# Patient Record
Sex: Female | Born: 1970 | Race: White | Hispanic: No | Marital: Married | Smoking: Current every day smoker
Health system: Southern US, Community
[De-identification: ages and names within clinical notes are randomized; demographics above are authoritative.]

## PROBLEM LIST (undated history)

## (undated) DIAGNOSIS — D649 Anemia, unspecified: Secondary | ICD-10-CM

## (undated) DIAGNOSIS — K219 Gastro-esophageal reflux disease without esophagitis: Secondary | ICD-10-CM

## (undated) HISTORY — PX: ABDOMINAL HYSTERECTOMY: SHX81

## (undated) HISTORY — PX: BACK SURGERY: SHX140

---

## 2008-01-16 ENCOUNTER — Encounter: Admission: RE | Admit: 2008-01-16 | Discharge: 2008-01-16 | Payer: Self-pay

## 2010-09-12 ENCOUNTER — Emergency Department (HOSPITAL_COMMUNITY)
Admission: EM | Admit: 2010-09-12 | Discharge: 2010-09-12 | Disposition: A | Discharge status: Home / Self Care | Payer: BC Managed Care – PPO | Attending: Emergency Medicine | Admitting: Emergency Medicine

## 2010-09-12 DIAGNOSIS — F1410 Cocaine abuse, uncomplicated: Secondary | ICD-10-CM | POA: Insufficient documentation

## 2010-09-12 LAB — COMPREHENSIVE METABOLIC PANEL
ALT: 9 U/L (ref 0–35)
Alkaline Phosphatase: 59 U/L (ref 39–117)
CO2: 25 mEq/L (ref 19–32)
GFR calc non Af Amer: >60 mL/min (ref >60)
Glucose, Bld: 81 mg/dL (ref 70–99)
Potassium: 3.7 mEq/L (ref 3.5–5.1)
Sodium: 143 mEq/L (ref 135–145)
Total Protein: 6.7 g/dL (ref 6.0–8.3)

## 2010-09-12 LAB — DIFFERENTIAL
Basophils Absolute: 0 10*3/uL (ref 0.0–0.1)
Lymphocytes Relative: 18 % (ref 12–46)
Monocytes Relative: 12 % (ref 3–12)

## 2010-09-12 LAB — CBC
HCT: 33.2 % — ABNORMAL LOW (ref 36.0–46.0)
Hemoglobin: 9.7 g/dL — ABNORMAL LOW (ref 12.0–15.0)
RBC: 4.7 MIL/uL (ref 3.87–5.11)
WBC: 14 10*3/uL — ABNORMAL HIGH (ref 4.0–10.5)

## 2010-09-12 LAB — URINE MICROSCOPIC-ADD ON

## 2010-09-12 LAB — RAPID URINE DRUG SCREEN, HOSP PERFORMED: Tetrahydrocannabinol: NOT DETECTED

## 2010-09-12 LAB — URINALYSIS, ROUTINE W REFLEX MICROSCOPIC
Glucose, UA: NEGATIVE mg/dL
Leukocytes, UA: NEGATIVE
Protein, ur: NEGATIVE mg/dL
Specific Gravity, Urine: 1.02 (ref 1.005–1.030)
Urobilinogen, UA: 0.2 mg/dL (ref 0.0–1.0)

## 2010-09-12 LAB — ETHANOL: Alcohol, Ethyl (B): <5 mg/dL (ref 0–10)

## 2011-03-06 ENCOUNTER — Encounter (HOSPITAL_COMMUNITY)
Admission: RE | Admit: 2011-03-06 | Discharge: 2011-03-06 | Disposition: A | Discharge status: Home / Self Care | Payer: Medicaid Other | Source: Ambulatory Visit | Attending: Neurosurgery | Admitting: Neurosurgery

## 2011-03-06 LAB — COMPREHENSIVE METABOLIC PANEL
ALT: 12 U/L (ref 0–35)
BUN: 8 mg/dL (ref 6–23)
Calcium: 9.6 mg/dL (ref 8.4–10.5)
Creatinine, Ser: 0.52 mg/dL (ref 0.50–1.10)
GFR calc Af Amer: >90 mL/min (ref >90)
Glucose, Bld: 111 mg/dL — ABNORMAL HIGH (ref 70–99)
Sodium: 140 mEq/L (ref 135–145)
Total Protein: 7 g/dL (ref 6.0–8.3)

## 2011-03-06 LAB — CBC
HCT: 31.1 % — ABNORMAL LOW (ref 36.0–46.0)
MCHC: 28.9 g/dL — ABNORMAL LOW (ref 30.0–36.0)
RDW: 18.4 % — ABNORMAL HIGH (ref 11.5–15.5)

## 2011-03-06 LAB — SURGICAL PCR SCREEN
MRSA, PCR: NEGATIVE
Staphylococcus aureus: NEGATIVE

## 2011-03-09 ENCOUNTER — Ambulatory Visit (HOSPITAL_COMMUNITY): Payer: Medicaid Other

## 2011-03-09 ENCOUNTER — Ambulatory Visit (HOSPITAL_COMMUNITY)
Admission: RE | Admit: 2011-03-09 | Discharge: 2011-03-10 | Disposition: A | Discharge status: Home / Self Care | Payer: Medicaid Other | Source: Ambulatory Visit | Attending: Neurosurgery | Admitting: Neurosurgery

## 2011-03-09 DIAGNOSIS — F17200 Nicotine dependence, unspecified, uncomplicated: Secondary | ICD-10-CM | POA: Insufficient documentation

## 2011-03-09 DIAGNOSIS — M7130 Other bursal cyst, unspecified site: Secondary | ICD-10-CM | POA: Insufficient documentation

## 2011-03-09 DIAGNOSIS — Z01812 Encounter for preprocedural laboratory examination: Secondary | ICD-10-CM | POA: Insufficient documentation

## 2012-04-20 ENCOUNTER — Encounter (HOSPITAL_BASED_OUTPATIENT_CLINIC_OR_DEPARTMENT_OTHER): Payer: Self-pay | Admitting: Emergency Medicine

## 2012-04-20 ENCOUNTER — Emergency Department (HOSPITAL_BASED_OUTPATIENT_CLINIC_OR_DEPARTMENT_OTHER)
Admission: EM | Admit: 2012-04-20 | Discharge: 2012-04-20 | Disposition: A | Discharge status: Home / Self Care | Payer: Self-pay | Attending: Emergency Medicine | Admitting: Emergency Medicine

## 2012-04-20 DIAGNOSIS — A599 Trichomoniasis, unspecified: Secondary | ICD-10-CM

## 2012-04-20 DIAGNOSIS — N760 Acute vaginitis: Secondary | ICD-10-CM | POA: Insufficient documentation

## 2012-04-20 DIAGNOSIS — Z202 Contact with and (suspected) exposure to infections with a predominantly sexual mode of transmission: Secondary | ICD-10-CM | POA: Insufficient documentation

## 2012-04-20 DIAGNOSIS — B9689 Other specified bacterial agents as the cause of diseases classified elsewhere: Secondary | ICD-10-CM

## 2012-04-20 DIAGNOSIS — A5901 Trichomonal vulvovaginitis: Secondary | ICD-10-CM | POA: Insufficient documentation

## 2012-04-20 LAB — URINALYSIS, ROUTINE W REFLEX MICROSCOPIC
Ketones, ur: NEGATIVE mg/dL
Leukocytes, UA: NEGATIVE
Nitrite: NEGATIVE
Protein, ur: NEGATIVE mg/dL
Urobilinogen, UA: 0.2 mg/dL (ref 0.0–1.0)

## 2012-04-20 LAB — PREGNANCY, URINE: Preg Test, Ur: NEGATIVE

## 2012-04-20 MED ORDER — LIDOCAINE HCL (PF) 1 % IJ SOLN: 5.0000 mL | Freq: Once | INTRAMUSCULAR | Status: DC

## 2012-04-20 MED ORDER — METRONIDAZOLE 500 MG PO TABS: 500.0000 mg | ORAL_TABLET | Freq: Two times a day (BID) | ORAL | Status: DC

## 2012-04-20 MED ORDER — DOXYCYCLINE HYCLATE 100 MG PO CAPS: 100.0000 mg | ORAL_CAPSULE | Freq: Two times a day (BID) | ORAL | Status: DC

## 2012-04-20 MED ORDER — CEFTRIAXONE SODIUM 250 MG IJ SOLR
250.0000 mg | Freq: Once | INTRAMUSCULAR | Status: AC
  Administered 2012-04-20: 250 mg via INTRAMUSCULAR
  Filled 2012-04-20: qty 250

## 2012-04-20 MED ORDER — LIDOCAINE HCL (PF) 1 % IJ SOLN
INTRAMUSCULAR | Status: AC
  Filled 2012-04-20: qty 5

## 2012-04-20 NOTE — ED Notes (Signed)
Pt exposed to chlamydia from female partner approximately 4 months.  Pt is having some itching, also yellow vaginal discharge.  Slight abdominal pain prior to menstrual cycle.

## 2012-04-20 NOTE — ED Provider Notes (Signed)
History     CSN: [id number]  Arrival date & time 04/20/12  1131   First MD Initiated Contact with Patient 04/20/12 1152      Chief Complaint  Patient presents with  . Exposure to STD    (Consider location/radiation/quality/duration/timing/severity/associated sxs/prior treatment) HPI Comments: Patient complains of vaginal discharge that has been worsening over last 2 months. She recently got a call from her sexual partner that he tested positive for Chlamydia. She has some intermittent abdominal cramping but denies any current abdominal pain. She denies any vomiting or diarrhea. Denies any fevers or chills. Denies a history of sexual transmitted diseases. Denies any urinary symptoms. She tried to treat her symptoms with over-the-counter yeast medication without improvement  Patient is a 41 y.o. female presenting with STD exposure.  Exposure to STD Pertinent negatives include no chest pain, no abdominal pain, no headaches and no shortness of breath.    No past medical history on file.  Past Surgical History  Procedure Date  . Back surgery     No family history on file.  History  Substance Use Topics  . Smoking status: Not on file  . Smokeless tobacco: Not on file  . Alcohol Use:     OB History    Grav Para Term Preterm Abortions TAB SAB Ect Mult Living                  Review of Systems  Constitutional: Negative for fever, chills, diaphoresis and fatigue.  HENT: Negative for congestion, rhinorrhea and sneezing.   Eyes: Negative.   Respiratory: Negative for cough, chest tightness and shortness of breath.   Cardiovascular: Negative for chest pain and leg swelling.  Gastrointestinal: Negative for nausea, vomiting, abdominal pain, diarrhea and blood in stool.  Genitourinary: Positive for vaginal discharge. Negative for frequency, hematuria, flank pain and difficulty urinating.  Musculoskeletal: Negative for back pain and arthralgias.  Skin: Negative for rash.    Neurological: Negative for dizziness, speech difficulty, weakness, numbness and headaches.    Allergies  Review of patient's allergies indicates no known allergies.  Home Medications   Current Outpatient Rx  Name  Route  Sig  Dispense  Refill  . DOXYCYCLINE HYCLATE 100 MG PO CAPS   Oral   Take 1 capsule (100 mg total) by mouth 2 (two) times daily.   20 capsule   0   . METRONIDAZOLE 500 MG PO TABS   Oral   Take 1 tablet (500 mg total) by mouth 2 (two) times daily.   14 tablet   0     BP 109/56  Pulse 80  Temp 98.3 F (36.8 C)  Resp 14  Ht 5' 7.5" (1.715 m)  Wt 150 lb (68.04 kg)  BMI 23.15 kg/m2  SpO2 100%  LMP 03/30/2012  Physical Exam  Constitutional: She is oriented to person, place, and time. She appears well-developed and well-nourished.  HENT:  Head: Normocephalic and atraumatic.  Eyes: Pupils are equal, round, and reactive to light.  Neck: Normal range of motion. Neck supple.  Cardiovascular: Normal rate, regular rhythm and normal heart sounds.   Pulmonary/Chest: Effort normal and breath sounds normal. No respiratory distress. She has no wheezes. She has no rales. She exhibits no tenderness.  Abdominal: Soft. Bowel sounds are normal. There is no tenderness. There is no rebound and no guarding.  Musculoskeletal: Normal range of motion. She exhibits no edema.  Lymphadenopathy:    She has no cervical adenopathy.  Neurological: She is alert and  oriented to person, place, and time.  Skin: Skin is warm and dry. No rash noted.  Psychiatric: She has a normal mood and affect.    ED Course  Procedures (including critical care time)  Results for orders placed during the hospital encounter of 04/20/12  PREGNANCY, URINE      Component Value Range   Preg Test, Ur NEGATIVE  NEGATIVE  URINALYSIS, ROUTINE W REFLEX MICROSCOPIC      Component Value Range   Color, Urine YELLOW  YELLOW   APPearance CLEAR  CLEAR   Specific Gravity, Urine 1.023  1.005 - 1.030   pH 5.5   5.0 - 8.0   Glucose, UA NEGATIVE  NEGATIVE mg/dL   Hgb urine dipstick NEGATIVE  NEGATIVE   Bilirubin Urine NEGATIVE  NEGATIVE   Ketones, ur NEGATIVE  NEGATIVE mg/dL   Protein, ur NEGATIVE  NEGATIVE mg/dL   Urobilinogen, UA 0.2  0.0 - 1.0 mg/dL   Nitrite NEGATIVE  NEGATIVE   Leukocytes, UA NEGATIVE  NEGATIVE  WET PREP, GENITAL      Component Value Range   Yeast Wet Prep HPF POC NONE SEEN  NONE SEEN   Trich, Wet Prep MANY (*) NONE SEEN   Clue Cells Wet Prep HPF POC TOO NUMEROUS TO COUNT (*) NONE SEEN   WBC, Wet Prep HPF POC TOO NUMEROUS TO COUNT (*) NONE SEEN   No results found.    1. Trichomonas   2. BV (bacterial vaginosis)       MDM  Patient with positive Trichomonas and bacterial vaginosis. Since she had a known exposure to chlamydia I will go ahead and treat with Rocephin and doxycycline and will also add Flagyl for treatment of the Trichomonas and bacterial vaginosis. Advised her to followup with her OB/GYN who is in [facility] within the next week if her symptoms are not improving or return here as needed for any worsening symptoms. Also advised her that she might want to followup with her physician at health department for further testing for sexual transmitted diseases such as HIV and syphilis        Rolan Bucco, MD 04/20/12 1307

## 2012-04-20 NOTE — Discharge Instructions (Signed)
Trichomoniasis Trichomoniasis is an infection, caused by the Trichomonas organism, that affects both women and men. In women, the outer female genitalia and the vagina are affected. In men, the penis is mainly affected, but the prostate and other reproductive organs can also be involved. Trichomoniasis is a sexually transmitted disease (STD) and is most often passed to another person through sexual contact. The majority of people who get trichomoniasis do so from a sexual encounter and are also at risk for other STDs. CAUSES   Sexual intercourse with an infected partner.  It can be present in swimming pools or hot tubs. SYMPTOMS   Abnormal gray-green frothy vaginal discharge in women.  Vaginal itching and irritation in women.  Itching and irritation of the area outside the vagina in women.  Penile discharge with or without pain in males.  Inflammation of the urethra (urethritis), causing painful urination.  Bleeding after sexual intercourse. RELATED COMPLICATIONS  Pelvic inflammatory disease.  Infection of the uterus (endometritis).  Infertility.  Tubal (ectopic) pregnancy.  It can be associated with other STDs, including gonorrhea and chlamydia, hepatitis B, and HIV. COMPLICATIONS DURING PREGNANCY  Early (premature) delivery.  Premature rupture of the membranes (PROM).  Low birth weight. DIAGNOSIS   Visualization of Trichomonas under the microscope from the vagina discharge.  Ph of the vagina greater than 4.5, tested with a test tape.  Trich Rapid Test.  Culture of the organism, but this is not usually needed.  It may be found on a Pap test.  Having a "strawberry cervix,"which means the cervix looks very red like a strawberry. TREATMENT   You may be given medication to fight the infection. Inform your caregiver if you could be or are pregnant. Some medications used to treat the infection should not be taken during pregnancy.  Over-the-counter medications or  creams to decrease itching or irritation may be recommended.  Your sexual partner will need to be treated if infected. HOME CARE INSTRUCTIONS   Take all medication prescribed by your caregiver.  Take over-the-counter medication for itching or irritation as directed by your caregiver.  Do not have sexual intercourse while you have the infection.  Do not douche or wear tampons.  Discuss your infection with your partner, as your partner may have acquired the infection from you. Or, your partner may have been the person who transmitted the infection to you.  Have your sex partner examined and treated if necessary.  Practice safe, informed, and protected sex.  See your caregiver for other STD testing. SEEK MEDICAL CARE IF:   You still have symptoms after you finish the medication.  You have an oral temperature above 102 F (38.9 C).  You develop belly (abdominal) pain.  You have pain when you urinate.  You have bleeding after sexual intercourse.  You develop a rash.  The medication makes you sick or makes you throw up (vomit). Document Released: 11/01/2000 Document Revised: 07/31/2011 Document Reviewed: 11/27/2008 [facility] Patient Information 2013 [place], [place]. Vaginitis Vaginitis is an infection. It causes soreness, swelling, and redness (inflammation) of the vagina. Many of these infections are sexually transmitted diseases (STDs). Having unprotected sex can cause further problems and complications such as:  Chronic pelvic pain.  Infertility.  Unwanted pregnancy.  Abortion.  Tubal pregnancy.  Infection passed on to the newborn.  Cancer. CAUSES   Monilia. This is a yeast or fungus infection, not an STD.  Bacterial vaginosis. The normal balance of bacteria in the vagina is disrupted and is replaced by an overgrowth  of certain bacteria.  Gonorrhea, chlamydia. These are bacterial infections that are STDs.  Vaginal sponges, diaphragms, and intrauterine  devices.  Trichomoniasis. This is a STD infection caused by a parasite.  Viruses like herpes and human papillomavirus. Both are STDs.  Pregnancy.  Immunosuppression. This occurs with certain conditions such as HIV infection or cancer.  Using bubble bath.  Taking certain antibiotic medicines.  Sporadic recurrence can occur if you become sick.  Diabetes.  Steroids.  Allergic reaction. If you have an allergy to:  Douches.  Soaps.  Spermicides.  Condoms.  Scented tampons or vaginal sprays. SYMPTOMS   Abnormal vaginal discharge.  Itching of the vagina.  Pain in the vagina.  Swelling of the vagina. In some cases, there are no symptoms. TREATMENT  Treatment will vary depending on the type of infection.  Bacteria or trichomonas are usually treated with oral antibiotics and sometimes vaginal cream or suppositories.  Monilia vaginitis is usually treated with vaginal creams, suppositories, or oral antifungal pills.  Viral vaginitis has no cure. However, the symptoms of herpes (a viral vaginitis) can be treated to relieve the discomfort. Human papillomavirus has no symptoms. However, there are treatments for the diseases caused by human papillomavirus.  With allergic vaginitis, you need to stop using the product that is causing the problem. Vaginal creams can be used to treat the symptoms.  When treating an STD, the sex partner should also be treated. HOME CARE INSTRUCTIONS   Take all the medicines as directed by your caregiver.  Do not use scented tampons, soaps, or vaginal sprays.  Do not douche.  Tell your sex partner if you have a vaginal infection or an STD.  Do not have sexual intercourse until you have treated the vaginitis.  Practice safe sex by using condoms. SEEK MEDICAL CARE IF:   You have abdominal pain.  Your symptoms get worse during treatment. Document Released: 03/05/2007 Document Revised: 07/31/2011 Document Reviewed: 10/29/2008 [facility]  Patient Information 2013 [place], [place].

## 2012-04-20 NOTE — ED Notes (Signed)
Pelvic cart is set up and ready for the doctor to use. The patient is undressed and in a gown. The bed is locked and in the the lowest position. The bed rails are up the call light is within reach.

## 2012-04-22 LAB — GC/CHLAMYDIA PROBE AMP: GC Probe RNA: NEGATIVE

## 2012-09-27 ENCOUNTER — Other Ambulatory Visit: Payer: Self-pay | Admitting: Neurosurgery

## 2012-09-27 DIAGNOSIS — M545 Low back pain: Secondary | ICD-10-CM

## 2012-10-04 ENCOUNTER — Ambulatory Visit
Admission: RE | Admit: 2012-10-04 | Discharge: 2012-10-04 | Disposition: A | Discharge status: Home / Self Care | Payer: BC Managed Care – PPO | Source: Ambulatory Visit | Attending: Neurosurgery | Admitting: Neurosurgery

## 2012-10-04 DIAGNOSIS — M545 Low back pain, unspecified: Secondary | ICD-10-CM

## 2012-10-04 MED ORDER — GADOBENATE DIMEGLUMINE 529 MG/ML IV SOLN
14.0000 mL | Freq: Once | INTRAVENOUS | Status: AC | PRN
  Administered 2012-10-04: 14 mL via INTRAVENOUS

## 2012-12-25 ENCOUNTER — Ambulatory Visit: Payer: BC Managed Care – PPO | Admitting: Physical Therapy

## 2012-12-30 ENCOUNTER — Ambulatory Visit: Payer: BC Managed Care – PPO | Attending: Neurosurgery | Admitting: Physical Therapy

## 2012-12-30 DIAGNOSIS — M6281 Muscle weakness (generalized): Secondary | ICD-10-CM | POA: Insufficient documentation

## 2012-12-30 DIAGNOSIS — M545 Low back pain, unspecified: Secondary | ICD-10-CM | POA: Insufficient documentation

## 2012-12-30 DIAGNOSIS — IMO0001 Reserved for inherently not codable concepts without codable children: Secondary | ICD-10-CM | POA: Insufficient documentation

## 2012-12-30 DIAGNOSIS — M2550 Pain in unspecified joint: Secondary | ICD-10-CM | POA: Insufficient documentation

## 2013-01-01 ENCOUNTER — Ambulatory Visit: Payer: BC Managed Care – PPO | Admitting: Physical Therapy

## 2013-01-07 ENCOUNTER — Ambulatory Visit: Payer: BC Managed Care – PPO | Admitting: Rehabilitation

## 2013-01-09 ENCOUNTER — Ambulatory Visit: Payer: BC Managed Care – PPO | Admitting: Physical Therapy

## 2013-01-13 ENCOUNTER — Ambulatory Visit: Payer: BC Managed Care – PPO | Admitting: Rehabilitation

## 2013-01-15 ENCOUNTER — Ambulatory Visit: Payer: BC Managed Care – PPO | Admitting: Physical Therapy

## 2013-01-16 ENCOUNTER — Ambulatory Visit: Payer: BC Managed Care – PPO | Admitting: Physical Therapy

## 2013-01-21 ENCOUNTER — Ambulatory Visit: Payer: Medicaid Other | Attending: Neurosurgery | Admitting: Physical Therapy

## 2013-01-21 ENCOUNTER — Ambulatory Visit: Payer: Medicaid Other | Admitting: Physical Therapy

## 2013-01-21 DIAGNOSIS — M6281 Muscle weakness (generalized): Secondary | ICD-10-CM | POA: Insufficient documentation

## 2013-01-21 DIAGNOSIS — M545 Low back pain, unspecified: Secondary | ICD-10-CM | POA: Insufficient documentation

## 2013-01-21 DIAGNOSIS — M2550 Pain in unspecified joint: Secondary | ICD-10-CM | POA: Insufficient documentation

## 2013-01-21 DIAGNOSIS — IMO0001 Reserved for inherently not codable concepts without codable children: Secondary | ICD-10-CM | POA: Insufficient documentation

## 2013-02-24 ENCOUNTER — Other Ambulatory Visit: Payer: Self-pay | Admitting: Neurosurgery

## 2013-03-17 ENCOUNTER — Encounter (HOSPITAL_COMMUNITY): Payer: Self-pay

## 2013-03-19 ENCOUNTER — Encounter (HOSPITAL_COMMUNITY): Payer: Self-pay

## 2013-03-19 ENCOUNTER — Encounter (HOSPITAL_COMMUNITY)
Admission: RE | Admit: 2013-03-19 | Discharge: 2013-03-19 | Disposition: A | Discharge status: Home / Self Care | Payer: Medicaid Other | Source: Ambulatory Visit | Attending: Neurosurgery | Admitting: Neurosurgery

## 2013-03-19 DIAGNOSIS — Z01812 Encounter for preprocedural laboratory examination: Secondary | ICD-10-CM | POA: Insufficient documentation

## 2013-03-19 HISTORY — DX: Anemia, unspecified: D64.9

## 2013-03-19 HISTORY — DX: Gastro-esophageal reflux disease without esophagitis: K21.9

## 2013-03-19 LAB — CBC
HCT: 37.3 % (ref 36.0–46.0)
Hemoglobin: 11.6 g/dL — ABNORMAL LOW (ref 12.0–15.0)
MCH: 21.7 pg — ABNORMAL LOW (ref 26.0–34.0)
MCHC: 31.1 g/dL (ref 30.0–36.0)
MCV: 69.7 fL — ABNORMAL LOW (ref 78.0–100.0)
Platelets: 326 K/uL (ref 150–400)
RBC: 5.35 MIL/uL — ABNORMAL HIGH (ref 3.87–5.11)
RDW: 20 % — ABNORMAL HIGH (ref 11.5–15.5)
WBC: 9.2 K/uL (ref 4.0–10.5)

## 2013-03-19 LAB — BASIC METABOLIC PANEL WITH GFR
BUN: 7 mg/dL (ref 6–23)
CO2: 20 meq/L (ref 19–32)
Calcium: 9.3 mg/dL (ref 8.4–10.5)
Chloride: 108 meq/L (ref 96–112)
Creatinine, Ser: 0.51 mg/dL (ref 0.50–1.10)
GFR calc Af Amer: >90 mL/min
GFR calc non Af Amer: >90 mL/min
Glucose, Bld: 114 mg/dL — ABNORMAL HIGH (ref 70–99)
Potassium: 3.8 meq/L (ref 3.5–5.1)
Sodium: 142 meq/L (ref 135–145)

## 2013-03-19 LAB — SURGICAL PCR SCREEN
MRSA, PCR: NEGATIVE
Staphylococcus aureus: NEGATIVE

## 2013-03-19 LAB — TYPE AND SCREEN
ABO/RH(D): O POS
Antibody Screen: NEGATIVE

## 2013-03-19 LAB — HCG, SERUM, QUALITATIVE: Preg, Serum: NEGATIVE

## 2013-03-19 LAB — ABO/RH: ABO/RH(D): O POS

## 2013-03-19 NOTE — Pre-Procedure Instructions (Addendum)
SADIA BELFIORE  03/19/2013   Your procedure is scheduled on:  Thursday, March 27, 2013  Report to [facility] Short Stay (use Main Entrance "A'') at 5:30 AM.  Call this number if you have problems the morning of surgery: [phone]   Remember:   Do not eat food or drink liquids after midnight.   Take these medicines the morning of surgery with A SIP OF WATER: ranitidine (ZANTAC) 150 MG tablet  If needed:oxyCODONE (ROXICODONE) 15 MG immediate release tablet for pain  Stop taking Aspirin and herbal medications. Do not take any NSAIDs ie: Ibuprofen, Advil,  Naproxen or any medication containing Aspirin.  Do not wear jewelry, make-up or nail polish.  Do not wear lotions, powders, or perfumes. You may wear deodorant.  Do not shave 48 hours prior to surgery.   Do not bring valuables to the hospital.  [facility] is not responsible for any belongings or valuables.               Contacts, dentures or bridgework may not be worn into surgery.  Leave suitcase in the car. After surgery it may be brought to your room.  For patients admitted to the hospital, discharge time is determined by your treatment team.               Patients discharged the day of surgery will not be allowed to drive home.  Name and phone number of your driver:   Special Instructions: Shower using CHG 2 nights before surgery and the night before surgery.  If you shower the day of surgery use CHG.  Use special wash - you have one bottle of CHG for all showers.  You should use approximately 1/3 of the bottle for each shower.   Please read over the following fact sheets that you were given: Pain Booklet, Coughing and Deep Breathing, Blood Transfusion Information, MRSA Information and Surgical Site Infection Prevention

## 2013-03-26 MED ORDER — DEXAMETHASONE SODIUM PHOSPHATE 10 MG/ML IJ SOLN
10.0000 mg | INTRAMUSCULAR | Status: AC
  Administered 2013-03-27: 10 mg via INTRAVENOUS
  Filled 2013-03-26: qty 1

## 2013-03-26 MED ORDER — CEFAZOLIN SODIUM-DEXTROSE 2-3 GM-% IV SOLR
2.0000 g | INTRAVENOUS | Status: AC
  Administered 2013-03-27: 2 g via INTRAVENOUS
  Filled 2013-03-26: qty 50

## 2013-03-27 ENCOUNTER — Encounter (HOSPITAL_COMMUNITY): Payer: Self-pay | Admitting: *Deleted

## 2013-03-27 ENCOUNTER — Ambulatory Visit (HOSPITAL_COMMUNITY): Payer: Medicaid Other

## 2013-03-27 ENCOUNTER — Encounter (HOSPITAL_COMMUNITY)
Admission: RE | Disposition: A | Discharge status: Home / Self Care | Payer: Self-pay | Source: Ambulatory Visit | Attending: Neurosurgery

## 2013-03-27 ENCOUNTER — Ambulatory Visit (HOSPITAL_COMMUNITY): Payer: Medicaid Other | Admitting: Anesthesiology

## 2013-03-27 ENCOUNTER — Inpatient Hospital Stay (HOSPITAL_COMMUNITY)
Admission: RE | Admit: 2013-03-27 | Discharge: 2013-03-30 | DRG: 460 | Disposition: A | Discharge status: Home / Self Care | Payer: Medicaid Other | Source: Ambulatory Visit | Attending: Neurosurgery | Admitting: Neurosurgery

## 2013-03-27 ENCOUNTER — Encounter (HOSPITAL_COMMUNITY): Payer: Medicaid Other | Admitting: Anesthesiology

## 2013-03-27 DIAGNOSIS — M48061 Spinal stenosis, lumbar region without neurogenic claudication: Secondary | ICD-10-CM | POA: Diagnosis present

## 2013-03-27 DIAGNOSIS — F1210 Cannabis abuse, uncomplicated: Secondary | ICD-10-CM | POA: Diagnosis present

## 2013-03-27 DIAGNOSIS — M4310 Spondylolisthesis, site unspecified: Principal | ICD-10-CM | POA: Diagnosis present

## 2013-03-27 DIAGNOSIS — Z79899 Other long term (current) drug therapy: Secondary | ICD-10-CM

## 2013-03-27 DIAGNOSIS — F17200 Nicotine dependence, unspecified, uncomplicated: Secondary | ICD-10-CM | POA: Diagnosis present

## 2013-03-27 DIAGNOSIS — M7130 Other bursal cyst, unspecified site: Secondary | ICD-10-CM | POA: Diagnosis present

## 2013-03-27 DIAGNOSIS — K219 Gastro-esophageal reflux disease without esophagitis: Secondary | ICD-10-CM | POA: Diagnosis present

## 2013-03-27 SURGERY — POSTERIOR LUMBAR FUSION 1 LEVEL
Anesthesia: General | Site: Spine Lumbar | Wound class: Clean

## 2013-03-27 MED ORDER — HYDROMORPHONE HCL PF 1 MG/ML IJ SOLN
INTRAMUSCULAR | Status: AC
  Filled 2013-03-27: qty 1

## 2013-03-27 MED ORDER — BUPIVACAINE HCL (PF) 0.5 % IJ SOLN
INTRAMUSCULAR | Status: DC | PRN
  Administered 2013-03-27: 20 mL

## 2013-03-27 MED ORDER — OXYCODONE HCL 5 MG PO TABS
5.0000 mg | ORAL_TABLET | Freq: Once | ORAL | Status: AC | PRN
  Administered 2013-03-27: 5 mg via ORAL

## 2013-03-27 MED ORDER — OXYCODONE-ACETAMINOPHEN 5-325 MG PO TABS
ORAL_TABLET | ORAL | Status: AC
  Filled 2013-03-27: qty 1

## 2013-03-27 MED ORDER — OXYCODONE HCL 5 MG/5ML PO SOLN: 5.0000 mg | Freq: Once | ORAL | Status: AC | PRN

## 2013-03-27 MED ORDER — ARTIFICIAL TEARS OP OINT
TOPICAL_OINTMENT | OPHTHALMIC | Status: DC | PRN
  Administered 2013-03-27: 1 via OPHTHALMIC

## 2013-03-27 MED ORDER — DEXAMETHASONE SODIUM PHOSPHATE 4 MG/ML IJ SOLN: 4.0000 mg | Freq: Four times a day (QID) | INTRAMUSCULAR | Status: AC

## 2013-03-27 MED ORDER — CEFAZOLIN SODIUM-DEXTROSE 2-3 GM-% IV SOLR
2.0000 g | Freq: Three times a day (TID) | INTRAVENOUS | Status: AC
  Administered 2013-03-27 – 2013-03-28 (×3): 2 g via INTRAVENOUS
  Filled 2013-03-27 (×3): qty 50

## 2013-03-27 MED ORDER — SUFENTANIL CITRATE 50 MCG/ML IV SOLN
INTRAVENOUS | Status: DC | PRN
  Administered 2013-03-27 (×8): 10 ug via INTRAVENOUS

## 2013-03-27 MED ORDER — ZOLPIDEM TARTRATE 5 MG PO TABS
5.0000 mg | ORAL_TABLET | Freq: Every evening | ORAL | Status: DC | PRN
  Administered 2013-03-27 – 2013-03-28 (×2): 5 mg via ORAL
  Filled 2013-03-27 (×2): qty 1

## 2013-03-27 MED ORDER — PROPOFOL 10 MG/ML IV BOLUS
INTRAVENOUS | Status: DC | PRN
  Administered 2013-03-27: 180 mg via INTRAVENOUS

## 2013-03-27 MED ORDER — NEOSTIGMINE METHYLSULFATE 1 MG/ML IJ SOLN
INTRAMUSCULAR | Status: DC | PRN
  Administered 2013-03-27: 3 mg via INTRAVENOUS

## 2013-03-27 MED ORDER — THROMBIN 20000 UNITS EX SOLR
CUTANEOUS | Status: DC | PRN
  Administered 2013-03-27: 09:00:00 via TOPICAL

## 2013-03-27 MED ORDER — OXYCODONE HCL 5 MG PO TABS
ORAL_TABLET | ORAL | Status: AC
  Filled 2013-03-27: qty 1

## 2013-03-27 MED ORDER — PANTOPRAZOLE SODIUM 40 MG IV SOLR
40.0000 mg | Freq: Every day | INTRAVENOUS | Status: DC
  Administered 2013-03-27: 40 mg via INTRAVENOUS
  Filled 2013-03-27 (×2): qty 40

## 2013-03-27 MED ORDER — MENTHOL 3 MG MT LOZG: 1.0000 | LOZENGE | OROMUCOSAL | Status: DC | PRN

## 2013-03-27 MED ORDER — MIDAZOLAM HCL 5 MG/5ML IJ SOLN
INTRAMUSCULAR | Status: DC | PRN
  Administered 2013-03-27: 2 mg via INTRAVENOUS

## 2013-03-27 MED ORDER — HYDROMORPHONE HCL PF 1 MG/ML IJ SOLN
0.2500 mg | INTRAMUSCULAR | Status: DC | PRN
  Administered 2013-03-27 (×6): 0.5 mg via INTRAVENOUS

## 2013-03-27 MED ORDER — VECURONIUM BROMIDE 10 MG IV SOLR
INTRAVENOUS | Status: DC | PRN
  Administered 2013-03-27 (×2): 2 mg via INTRAVENOUS
  Administered 2013-03-27: 3 mg via INTRAVENOUS
  Administered 2013-03-27: 1 mg via INTRAVENOUS

## 2013-03-27 MED ORDER — HYDROMORPHONE HCL PF 1 MG/ML IJ SOLN
1.0000 mg | INTRAMUSCULAR | Status: DC | PRN
  Administered 2013-03-27 (×3): 1 mg via INTRAMUSCULAR
  Administered 2013-03-27: 1.5 mg via INTRAMUSCULAR
  Administered 2013-03-28: 1 mg via INTRAMUSCULAR
  Administered 2013-03-28: 1.5 mg via INTRAMUSCULAR
  Administered 2013-03-28: 0.5 mg via INTRAMUSCULAR
  Administered 2013-03-28 (×2): 1 mg via INTRAMUSCULAR
  Administered 2013-03-28 – 2013-03-29 (×2): 1.5 mg via INTRAMUSCULAR
  Administered 2013-03-29: 1 mg via INTRAMUSCULAR
  Administered 2013-03-29 (×2): 1.5 mg via INTRAMUSCULAR
  Filled 2013-03-27 (×2): qty 1
  Filled 2013-03-27: qty 2
  Filled 2013-03-27 (×2): qty 1
  Filled 2013-03-27 (×2): qty 2
  Filled 2013-03-27 (×2): qty 1
  Filled 2013-03-27: qty 2
  Filled 2013-03-27: qty 1
  Filled 2013-03-27: qty 2
  Filled 2013-03-27: qty 1
  Filled 2013-03-27: qty 2

## 2013-03-27 MED ORDER — 0.9 % SODIUM CHLORIDE (POUR BTL) OPTIME
TOPICAL | Status: DC | PRN
  Administered 2013-03-27: 1000 mL

## 2013-03-27 MED ORDER — PROMETHAZINE HCL 25 MG/ML IJ SOLN: 6.2500 mg | INTRAMUSCULAR | Status: DC | PRN

## 2013-03-27 MED ORDER — ACETAMINOPHEN 650 MG RE SUPP: 650.0000 mg | RECTAL | Status: DC | PRN

## 2013-03-27 MED ORDER — SODIUM CHLORIDE 0.9 % IR SOLN
Status: DC | PRN
  Administered 2013-03-27: 09:00:00

## 2013-03-27 MED ORDER — GLYCOPYRROLATE 0.2 MG/ML IJ SOLN
INTRAMUSCULAR | Status: DC | PRN
  Administered 2013-03-27: 0.4 mg via INTRAVENOUS

## 2013-03-27 MED ORDER — LIDOCAINE HCL (CARDIAC) 20 MG/ML IV SOLN
INTRAVENOUS | Status: DC | PRN
  Administered 2013-03-27: 80 mg via INTRAVENOUS

## 2013-03-27 MED ORDER — SODIUM CHLORIDE 0.9 % IV SOLN: 250.0000 mL | INTRAVENOUS | Status: DC

## 2013-03-27 MED ORDER — OXYCODONE HCL 5 MG PO TABS
ORAL_TABLET | ORAL | Status: AC
  Administered 2013-03-27: 5 mg
  Filled 2013-03-27: qty 1

## 2013-03-27 MED ORDER — SODIUM CHLORIDE 0.9 % IJ SOLN: 3.0000 mL | INTRAMUSCULAR | Status: DC | PRN

## 2013-03-27 MED ORDER — OXYCODONE-ACETAMINOPHEN 5-325 MG PO TABS
1.0000 | ORAL_TABLET | ORAL | Status: DC | PRN
  Administered 2013-03-27 (×2): 2 via ORAL
  Administered 2013-03-27: 1 via ORAL
  Administered 2013-03-28: 2 via ORAL
  Administered 2013-03-28: 1 via ORAL
  Administered 2013-03-28 – 2013-03-30 (×12): 2 via ORAL
  Filled 2013-03-27 (×17): qty 2

## 2013-03-27 MED ORDER — ONDANSETRON HCL 4 MG/2ML IJ SOLN: 4.0000 mg | INTRAMUSCULAR | Status: DC | PRN

## 2013-03-27 MED ORDER — PHENYLEPHRINE HCL 10 MG/ML IJ SOLN
INTRAMUSCULAR | Status: DC | PRN
  Administered 2013-03-27 (×5): 40 ug via INTRAVENOUS
  Administered 2013-03-27: 80 ug via INTRAVENOUS

## 2013-03-27 MED ORDER — CYCLOBENZAPRINE HCL 10 MG PO TABS
ORAL_TABLET | ORAL | Status: AC
  Administered 2013-03-27: 10 mg
  Filled 2013-03-27: qty 1

## 2013-03-27 MED ORDER — ACETAMINOPHEN 325 MG PO TABS: 650.0000 mg | ORAL_TABLET | ORAL | Status: DC | PRN

## 2013-03-27 MED ORDER — ONDANSETRON HCL 4 MG/2ML IJ SOLN
INTRAMUSCULAR | Status: DC | PRN
  Administered 2013-03-27: 4 mg via INTRAVENOUS

## 2013-03-27 MED ORDER — CYCLOBENZAPRINE HCL 10 MG PO TABS
10.0000 mg | ORAL_TABLET | Freq: Three times a day (TID) | ORAL | Status: DC | PRN
  Administered 2013-03-27 – 2013-03-30 (×3): 10 mg via ORAL
  Filled 2013-03-27 (×3): qty 1

## 2013-03-27 MED ORDER — LACTATED RINGERS IV SOLN
INTRAVENOUS | Status: DC | PRN
  Administered 2013-03-27 (×3): via INTRAVENOUS

## 2013-03-27 MED ORDER — KCL IN DEXTROSE-NACL 20-5-0.45 MEQ/L-%-% IV SOLN
80.0000 mL/h | INTRAVENOUS | Status: DC
  Administered 2013-03-27: 80 mL/h via INTRAVENOUS
  Filled 2013-03-27 (×7): qty 1000

## 2013-03-27 MED ORDER — PHENOL 1.4 % MT LIQD: 1.0000 | OROMUCOSAL | Status: DC | PRN

## 2013-03-27 MED ORDER — ROCURONIUM BROMIDE 100 MG/10ML IV SOLN
INTRAVENOUS | Status: DC | PRN
  Administered 2013-03-27: 50 mg via INTRAVENOUS

## 2013-03-27 MED ORDER — DEXAMETHASONE 4 MG PO TABS
4.0000 mg | ORAL_TABLET | Freq: Four times a day (QID) | ORAL | Status: AC
  Administered 2013-03-27 (×2): 4 mg via ORAL
  Filled 2013-03-27 (×4): qty 1

## 2013-03-27 MED ORDER — SODIUM CHLORIDE 0.9 % IJ SOLN
3.0000 mL | Freq: Two times a day (BID) | INTRAMUSCULAR | Status: DC
  Administered 2013-03-27 – 2013-03-29 (×5): 3 mL via INTRAVENOUS

## 2013-03-27 MED ORDER — MIDAZOLAM HCL 2 MG/2ML IJ SOLN
INTRAMUSCULAR | Status: AC
  Administered 2013-03-27: 1 mg
  Filled 2013-03-27: qty 2

## 2013-03-27 SURGICAL SUPPLY — 64 items
BAG DECANTER FOR FLEXI CONT (MISCELLANEOUS) ×2 IMPLANT
BENZOIN TINCTURE PRP APPL 2/3 (GAUZE/BANDAGES/DRESSINGS) ×4 IMPLANT
BLADE SURG ROTATE 9660 (MISCELLANEOUS) IMPLANT
BONE EQUIVA 10CC (Bone Implant) ×2 IMPLANT
BRUSH SCRUB EZ PLAIN DRY (MISCELLANEOUS) ×2 IMPLANT
BUR CUTTER 7.0 ROUND (BURR) ×2 IMPLANT
BUR MATCHSTICK NEURO 3.0 LAGG (BURR) ×2 IMPLANT
CANISTER SUCT 3000ML (MISCELLANEOUS) ×2 IMPLANT
CONT SPEC 4OZ CLIKSEAL STRL BL (MISCELLANEOUS) ×4 IMPLANT
COVER BACK TABLE 24X17X13 BIG (DRAPES) IMPLANT
COVER TABLE BACK 60X90 (DRAPES) ×2 IMPLANT
DERMABOND ADVANCED (GAUZE/BANDAGES/DRESSINGS)
DERMABOND ADVANCED .7 DNX12 (GAUZE/BANDAGES/DRESSINGS) IMPLANT
DRAPE C-ARM 42X72 X-RAY (DRAPES) ×8 IMPLANT
DRAPE LAPAROTOMY 100X72X124 (DRAPES) ×2 IMPLANT
DRAPE SURG 17X23 STRL (DRAPES) ×4 IMPLANT
DRESSING TELFA 8X3 (GAUZE/BANDAGES/DRESSINGS) IMPLANT
DRSG OPSITE POSTOP 4X8 (GAUZE/BANDAGES/DRESSINGS) ×2 IMPLANT
DURAPREP 26ML APPLICATOR (WOUND CARE) ×2 IMPLANT
ELECT REM PT RETURN 9FT ADLT (ELECTROSURGICAL) ×2
ELECTRODE REM PT RTRN 9FT ADLT (ELECTROSURGICAL) ×1 IMPLANT
EVACUATOR 1/8 PVC DRAIN (DRAIN) ×2 IMPLANT
GAUZE SPONGE 4X4 16PLY XRAY LF (GAUZE/BANDAGES/DRESSINGS) IMPLANT
GLOVE BIO SURGEON STRL SZ7 (GLOVE) ×2 IMPLANT
GLOVE BIOGEL PI IND STRL 7.0 (GLOVE) IMPLANT
GLOVE BIOGEL PI IND STRL 8.5 (GLOVE) ×2 IMPLANT
GLOVE BIOGEL PI INDICATOR 7.0 (GLOVE)
GLOVE BIOGEL PI INDICATOR 8.5 (GLOVE) ×2
GLOVE ECLIPSE 7.5 STRL STRAW (GLOVE) ×2 IMPLANT
GLOVE ECLIPSE 8.0 STRL XLNG CF (GLOVE) ×4 IMPLANT
GLOVE EXAM NITRILE LRG STRL (GLOVE) IMPLANT
GLOVE EXAM NITRILE MD LF STRL (GLOVE) IMPLANT
GLOVE EXAM NITRILE XS STR PU (GLOVE) IMPLANT
GLOVE SURG SS PI 8.0 STRL IVOR (GLOVE) ×8 IMPLANT
GOWN BRE IMP SLV AUR LG STRL (GOWN DISPOSABLE) ×6 IMPLANT
GOWN BRE IMP SLV AUR XL STRL (GOWN DISPOSABLE) IMPLANT
GOWN STRL REIN 2XL LVL4 (GOWN DISPOSABLE) ×4 IMPLANT
KIT BASIN OR (CUSTOM PROCEDURE TRAY) ×2 IMPLANT
KIT ROOM TURNOVER OR (KITS) ×2 IMPLANT
NEEDLE HYPO 22GX1.5 SAFETY (NEEDLE) ×2 IMPLANT
NS IRRIG 1000ML POUR BTL (IV SOLUTION) ×2 IMPLANT
PACK LAMINECTOMY NEURO (CUSTOM PROCEDURE TRAY) ×2 IMPLANT
PAD ARMBOARD 7.5X6 YLW CONV (MISCELLANEOUS) ×10 IMPLANT
PATTIES SURGICAL .75X.75 (GAUZE/BANDAGES/DRESSINGS) ×2 IMPLANT
PEDIGUARD CURV (INSTRUMENTS) ×2 IMPLANT
PEEK CAGE 11X11X34 (Cage) ×2 IMPLANT
ROD BENT 40MM (Rod) ×4 IMPLANT
SCREW SEQUOIA 5.5X40MM (Screw) ×8 IMPLANT
SPONGE GAUZE 4X4 12PLY (GAUZE/BANDAGES/DRESSINGS) IMPLANT
SPONGE LAP 4X18 X RAY DECT (DISPOSABLE) IMPLANT
SPONGE SURGIFOAM ABS GEL 100 (HEMOSTASIS) ×2 IMPLANT
STRIP CLOSURE SKIN 1/2X4 (GAUZE/BANDAGES/DRESSINGS) ×4 IMPLANT
SUT PROLENE 0 CT 1 30 (SUTURE) ×2 IMPLANT
SUT VIC AB 0 CT1 18XCR BRD8 (SUTURE) ×1 IMPLANT
SUT VIC AB 0 CT1 8-18 (SUTURE) ×1
SUT VIC AB 2-0 OS6 18 (SUTURE) ×8 IMPLANT
SUT VIC AB 3-0 CP2 18 (SUTURE) ×2 IMPLANT
SYR 20ML ECCENTRIC (SYRINGE) ×2 IMPLANT
TOP CLSR SEQUOIA (Orthopedic Implant) ×8 IMPLANT
TOWEL OR 17X24 6PK STRL BLUE (TOWEL DISPOSABLE) ×2 IMPLANT
TOWEL OR 17X26 10 PK STRL BLUE (TOWEL DISPOSABLE) ×2 IMPLANT
TRAP SPECIMEN MUCOUS 40CC (MISCELLANEOUS) ×2 IMPLANT
TRAY FOLEY CATH 14FRSI W/METER (CATHETERS) ×2 IMPLANT
WATER STERILE IRR 1000ML POUR (IV SOLUTION) ×2 IMPLANT

## 2013-03-27 NOTE — Progress Notes (Signed)
Dr Jacklynn Bue here and spoke with pt re her meds and pain issues. She says she is feeling much better now. Dr Gerlene Fee was also updated about a half hour ago.

## 2013-03-27 NOTE — Progress Notes (Signed)
Pt. States she has congestion,clear drainage due to allergies,smoking.

## 2013-03-27 NOTE — H&P (Signed)
  Brenda Chen is an 42 y.o. female.   Chief Complaint: back and bilateral leg pain HPI: the patient is a 42 year old female who I back surgery number of years ago for a synovial cyst. She did well for a number of years then began to develop a back pain once again with discomfort on the opposite side. She's tried a great deal conservative therapy hoping to avoid anymore surgery but has been unrelenting and progressive. She underwent imaging studies which showed a grade 1 spondylolisthesis and synovial cyst. After discussing the options the patient requested surgery now she comes for interbody fusion with pedicle screw fixation. I've had a long discussion with her regarding the risks and benefits of surgical intervention. The risks discussed include but are not limited to bleeding infection weakness was paralysis spinal fluid leak instrumentation nonunion coma and death. We have discussed alternative methods of therapy offered risks and benefits of nonintervention. She's had the opportunity to ask numerous questions and appears to understand. With the patient hand she has requested to proceed with surgery.  Past Medical History  Diagnosis Date  . Anemia   . GERD (gastroesophageal reflux disease)     Past Surgical History  Procedure Laterality Date  . Back surgery      History reviewed. No pertinent family history. Social History:  reports that she has been smoking.  She does not have any smokeless tobacco history on file. She reports that she uses illicit drugs (Marijuana). She reports that she does not drink alcohol.  Allergies: No Known Allergies  Medications Prior to Admission  Medication Sig Dispense Refill  . cyclobenzaprine (FLEXERIL) 10 MG tablet Take 10 mg by mouth 3 (three) times daily as needed for muscle spasms.      . ferrous sulfate 325 (65 FE) MG tablet Take 650 mg by mouth daily with breakfast.      . oxyCODONE (ROXICODONE) 15 MG immediate release tablet Take 15 mg by mouth  every 4 (four) hours as needed for pain.      . ranitidine (ZANTAC) 150 MG tablet Take 150 mg by mouth daily.        No results found for this or any previous visit (from the past 48 hour(s)). No results found.  positive primarily for the back and leg pain. Does have some reflux.  Blood pressure 115/80, pulse 68, temperature 98.3 F (36.8 C), temperature source Oral, resp. rate 18, last menstrual period 03/25/2013, SpO2 100.00%.  the patient is awake or and oriented. His no facial asymmetry. It was mildly antalgic. Strength is 5 over 5 sensation is decreased on the dorsum of the foot Assessment/Plan Impression is that of listhesis of synovial cyst with previous surgery and L4-5. The plan is for an L4-5 interbody fusion with pedicle screw fixation.  Reinaldo Meeker, MD 03/27/2013, 7:33 AM

## 2013-03-27 NOTE — Anesthesia Procedure Notes (Signed)
Procedure Name: Intubation Date/Time: 03/27/2013 7:45 AM Performed by: Lovie Chol Pre-anesthesia Checklist: Patient identified, Emergency Drugs available, Suction available, Patient being monitored and Timeout performed Patient Re-evaluated:Patient Re-evaluated prior to inductionOxygen Delivery Method: Circle system utilized Preoxygenation: Pre-oxygenation with 100% oxygen Intubation Type: IV induction Ventilation: Mask ventilation without difficulty Laryngoscope Size: Miller and 2 Grade View: Grade I Tube type: Oral Tube size: 7.5 mm Number of attempts: 1 Airway Equipment and Method: Stylet Placement Confirmation: ETT inserted through vocal cords under direct vision,  positive ETCO2,  CO2 detector and breath sounds checked- equal and bilateral Secured at: 21 cm Tube secured with: Tape Dental Injury: Teeth and Oropharynx as per pre-operative assessment

## 2013-03-27 NOTE — Progress Notes (Signed)
Utilization review completed. Makalya Nave, RN, BSN. 

## 2013-03-27 NOTE — Progress Notes (Signed)
Lunch relief by Elder Negus RN

## 2013-03-27 NOTE — Anesthesia Preprocedure Evaluation (Addendum)
Anesthesia Evaluation  Patient identified by MRN, date of birth, ID band Patient awake    Reviewed: Allergy & Precautions, H&P , NPO status , Patient's Chart, lab work & pertinent test results  History of Anesthesia Complications Negative for: history of anesthetic complications  Airway Mallampati: I TM Distance: >3 FB Neck ROM: Full    Dental  (+) Teeth Intact and Dental Advisory Given   Pulmonary Current Smoker,          Cardiovascular negative cardio ROS  Rhythm:Regular Rate:Normal     Neuro/Psych negative neurological ROS  negative psych ROS   GI/Hepatic Neg liver ROS, GERD-  Medicated and Controlled,  Endo/Other  negative endocrine ROS  Renal/GU negative Renal ROS     Musculoskeletal   Abdominal   Peds  Hematology anemia   Anesthesia Other Findings   Reproductive/Obstetrics negative OB ROS                          Anesthesia Physical Anesthesia Plan  ASA: I  Anesthesia Plan: General   Post-op Pain Management:    Induction: Intravenous  Airway Management Planned: Oral ETT  Additional Equipment:   Intra-op Plan:   Post-operative Plan: Extubation in OR  Informed Consent: I have reviewed the patients History and Physical, chart, labs and discussed the procedure including the risks, benefits and alternatives for the proposed anesthesia with the patient or authorized representative who has indicated his/her understanding and acceptance.   Dental advisory given  Plan Discussed with: CRNA and Surgeon  Anesthesia Plan Comments:         Anesthesia Quick Evaluation

## 2013-03-27 NOTE — Progress Notes (Signed)
Pt's spouse at bedside. Pt  told him we are not doing anything for her pain. He is upset and questioning why nothing is being done. I explained the meds she has had and that I have spoken to the anesthesiologist twice, that despite meds, she will not be pain free and that we need to keep her safe. She does appear more comfortable than earlier. I have updated dr Jacklynn Bue again.

## 2013-03-27 NOTE — Op Note (Signed)
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 Previous lumbar incision was opened up and carried on the spinous processes. Subperiosteal dissection was then carried out bilaterally on the spinous processes lamina facet joint and the far lateral region to identify the transverse processes of L4 and L5. X-ray showed approach the appropriate level. On the left side there was marked scarring. The patient is a segment cannot side and was elected to simply decompress the symptomatic side in place 1 large cage transversely. We therefore did a generous laminotomy on the right removing the inferior 80% of the L4 lamina the medial three quarters of the facet joint and the superior one third of the L5 lamina. Residual bone and hypertrophic ligamentum flavum along with synovial cyst removed to decompress the L4 and L5 nerve roots. They're both tracked out their foramen without difficulty. The disc was then incised and thoroughly cleaned out pituitary rongeurs and curettes. It was then prepared for interbody fusion. Using a variety of instruments the endplates were decorticated until was ready for interbody fusion. Prior to placing the interbody spacer a mixture of autologous bone morselized allograft was packed deep within the interspace to help with interbody fusion. We then placed an 11 x 11 x 30 mm cage into the disc space and kicked it transversely. This was filled with a mixture of autologous bone  morselized allograft. Fossae showed excellent position of the graft. Irrigation was carried out and any bleeding control proper coagulation Gelfoam. We then placed pedicle screws in standard fashion. We used drill hole entry points passed the ultrasound guided probe through the pedicle and then tapped with a 4.5 mm tap and placed 5.5 x 40 mm screws at L4 and L5 bilaterally. We then decorticated the far lateral region placed a mixture of autologous bone morselized allograft for posterolateral fusion. We then chose appropriate length rods and secured them to the top of the screws and did tightening and final tightening with torque and counter torque. Irrigation was carried out and any bleeding control proper coagulation Gelfoam. Final fluoroscopy with excellent in AP lateral direction. Left an epidural drain in the epidural space and brought out through a separate stab incision. The was then closed in multiple layers of Vicryl on the muscle fascia subcutaneous and subcuticular tissues. Running locking Prolene was placed on the skin. Shortness was then applied and the patient was extubated and taken to recovery room in stable condition.

## 2013-03-27 NOTE — Preoperative (Signed)
Beta Blockers   Reason not to administer Beta Blockers:Not Applicable 

## 2013-03-27 NOTE — Progress Notes (Signed)
Dr Jacklynn Bue updated again re pt's c/o of pain and meds not working for her.

## 2013-03-27 NOTE — Progress Notes (Signed)
Pt rates her pain a 7-8/10 after 2mg  IV Dilaudid, po Flexeril and Oxy IR. Dr Jacklynn Bue updated, new order for additional 1 mg IV Dilaudid.  Will cont to monitor pt closely.

## 2013-03-27 NOTE — Anesthesia Postprocedure Evaluation (Signed)
  Anesthesia Post-op Note  Patient: Brenda Chen  Procedure(s) Performed: Procedure(s) with comments: RIGHT LUMBAR FOUR FIVE TRANSVERSE LUMBAR INTERBODY FUSION (N/A) - RIGHT  Patient Location: PACU  Anesthesia Type:General  Level of Consciousness: awake and alert   Airway and Oxygen Therapy: Patient Spontanous Breathing  Post-op Pain: mild  Post-op Assessment: Post-op Vital signs reviewed  Post-op Vital Signs: stable  Complications: No apparent anesthesia complications

## 2013-03-27 NOTE — Addendum Note (Signed)
Addendum created 03/27/13 1426 by Lovie Chol, CRNA   Modules edited: Anesthesia Flowsheet

## 2013-03-27 NOTE — Transfer of Care (Signed)
Immediate Anesthesia Transfer of Care Note  Patient: Brenda Chen  Procedure(s) Performed: Procedure(s) with comments: RIGHT LUMBAR FOUR FIVE TRANSVERSE LUMBAR INTERBODY FUSION (N/A) - RIGHT  Patient Location: PACU  Anesthesia Type:General  Level of Consciousness: awake, alert , oriented and patient cooperative  Airway & Oxygen Therapy: Patient Spontanous Breathing and Patient connected to nasal cannula oxygen  Post-op Assessment: Report given to PACU RN and Post -op Vital signs reviewed and stable  Post vital signs: Reviewed  Complications: No apparent anesthesia complications

## 2013-03-28 MED ORDER — INFLUENZA VAC SPLIT QUAD 0.5 ML IM SUSP
0.5000 mL | INTRAMUSCULAR | Status: DC
  Filled 2013-03-28: qty 0.5

## 2013-03-28 MED ORDER — PANTOPRAZOLE SODIUM 40 MG PO TBEC
40.0000 mg | DELAYED_RELEASE_TABLET | Freq: Every day | ORAL | Status: DC
  Administered 2013-03-28 – 2013-03-29 (×2): 40 mg via ORAL
  Filled 2013-03-28 (×2): qty 1

## 2013-03-28 NOTE — Progress Notes (Signed)
Orthopedic Tech Progress Note Patient Details:  Brenda Chen Oct 29, 1970 [id number]  Patient ID: Brenda Chen, female   DOB: Jul 04, 1970, 42 y.o.   MRN: [id number]   Brenda Chen 03/28/2013, 12:43 PMCalled bio-tech for lumbar corset.

## 2013-03-28 NOTE — Progress Notes (Signed)
Patient ID: Brenda Chen, female   DOB: 08/28/1970, 42 y.o.   MRN: [id number] Afeb. vss Leg pain markedly improved. Wound fine. Increasing activity. Plan for d/c tomorrow.

## 2013-03-29 MED ORDER — OXYCODONE HCL 5 MG PO TABS
5.0000 mg | ORAL_TABLET | ORAL | Status: DC | PRN
  Administered 2013-03-29 – 2013-03-30 (×7): 5 mg via ORAL
  Filled 2013-03-29 (×7): qty 1

## 2013-03-29 NOTE — Progress Notes (Signed)
Filed Vitals:   03/28/13 1810 03/28/13 2020 03/29/13 0102 03/29/13 0529  BP: 113/77 97/64 100/68 94/59  Pulse: 71 76 92 67  Temp: 98.7 F (37.1 C) 98.6 F (37 C) 98.6 F (37 C) 98.4 F (36.9 C)  TempSrc: Oral Oral Oral Oral  Resp: 16 16 16 16   Height:      Weight:      SpO2: 99% 99% 96% 99%    Patient continuing to have difficulties with pain management. Has required repeated Dilaudid IM, in addition to Percocet 5/325 2 tablets every 4 hours.  Drainage into Hemovac drain has steadily decreased. Drain removed, dressing removed, small dressing placed over drain site, wound left open to air, wound healing nicely.  Limited ambulation in the halls, encouraged to increase.  Plan: We'll add OxyIR 5 mg to the 2 Percocet every 4 hours (the patient was taking oxycodone 15 mg every 4 hours at home since the summer. Once able to manage pain without parenteral medications, will be able to discharge to home.  Hewitt Shorts, MD 03/29/2013, 8:18 AM

## 2013-03-29 NOTE — Progress Notes (Signed)
Patient encouraged to try to control pain with PO medications and use IM dilaudid for breakthrough pain this evening. Pt unwilling to do so and is insisting on continuing to take meds together. Respiratory status stable. BP soft, but appears to be patient's baseline. Will continue to closely monitor.

## 2013-03-30 MED ORDER — OXYCODONE HCL 15 MG PO TABS: 15.0000 mg | ORAL_TABLET | ORAL | Status: DC | PRN

## 2013-03-30 NOTE — Progress Notes (Signed)
Patient ID: Brenda Chen, female   DOB: May 20, 1971, 42 y.o.   MRN: [id number] Discharge home

## 2013-03-30 NOTE — Progress Notes (Signed)
Pt D/c  Home to self care . Discharge  and follow up instruction give. Pt verbal;ized good understanding condition was stable.

## 2013-03-30 NOTE — Progress Notes (Signed)
Pt refused flu shot.

## 2013-03-30 NOTE — Discharge Summary (Signed)
  Physician Discharge Summary  Patient ID: Brenda Chen MRN: [id number] DOB/AGE: 02-14-1971 42 y.o.  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 *   Discharged Condition: good  Hospital Course: Patient is been hospitalized underwent an L4-5 decompression and fusion postoperatively patient did very well recovered in the floor on the floor she was convalescing well ambulating and voiding spontaneously at some difficulty with pain management however we get her medications under control and patient was stable for discharge with followup in one week with Dr. Gerlene Fee.  Consults: Significant Diagnostic Studies: Treatments: L4-5 decompression and fusion Discharge Exam: Blood pressure 121/78, pulse 76, temperature 97.7 F (36.5 C), temperature source Oral, resp. rate 18, height 5\' 8"  (1.727 m), weight 64.18 kg (141 lb 7.9 oz), last menstrual period 03/25/2013, SpO2 100.00%. Strength out of 5 wound clean and dry  Disposition: Home     Medication List         cyclobenzaprine 10 MG tablet  Commonly known as:  FLEXERIL  Take 10 mg by mouth 3 (three) times daily as needed for muscle spasms.     ferrous sulfate 325 (65 FE) MG tablet  Take 650 mg by mouth daily with breakfast.     oxyCODONE 15 MG immediate release tablet  Commonly known as:  ROXICODONE  Take 1 tablet (15 mg total) by mouth every 4 (four) hours as needed for moderate pain or severe pain.     oxyCODONE 15 MG immediate release tablet  Commonly known as:  ROXICODONE  Take 15 mg by mouth every 4 (four) hours as needed for pain.     ranitidine 150 MG tablet  Commonly known as:  ZANTAC  Take 150 mg by mouth daily.           Follow-up Information   Follow up with Reinaldo Meeker, MD.   Specialty:  Neurosurgery   Contact information:   [address] [place]  [address] [phone]       Signed: Govanni Plemons P 03/30/2013, 8:58 AM

## 2013-05-25 ENCOUNTER — Emergency Department (HOSPITAL_BASED_OUTPATIENT_CLINIC_OR_DEPARTMENT_OTHER)
Admission: EM | Admit: 2013-05-25 | Discharge: 2013-05-25 | Disposition: A | Discharge status: Home / Self Care | Payer: BC Managed Care – PPO | Attending: Emergency Medicine | Admitting: Emergency Medicine

## 2013-05-25 ENCOUNTER — Encounter (HOSPITAL_BASED_OUTPATIENT_CLINIC_OR_DEPARTMENT_OTHER): Payer: Self-pay | Admitting: Emergency Medicine

## 2013-05-25 ENCOUNTER — Emergency Department (HOSPITAL_BASED_OUTPATIENT_CLINIC_OR_DEPARTMENT_OTHER): Payer: BC Managed Care – PPO

## 2013-05-25 DIAGNOSIS — F17200 Nicotine dependence, unspecified, uncomplicated: Secondary | ICD-10-CM | POA: Insufficient documentation

## 2013-05-25 DIAGNOSIS — D649 Anemia, unspecified: Secondary | ICD-10-CM | POA: Insufficient documentation

## 2013-05-25 DIAGNOSIS — N390 Urinary tract infection, site not specified: Secondary | ICD-10-CM | POA: Insufficient documentation

## 2013-05-25 DIAGNOSIS — R1031 Right lower quadrant pain: Secondary | ICD-10-CM | POA: Insufficient documentation

## 2013-05-25 DIAGNOSIS — K219 Gastro-esophageal reflux disease without esophagitis: Secondary | ICD-10-CM | POA: Insufficient documentation

## 2013-05-25 DIAGNOSIS — Z79899 Other long term (current) drug therapy: Secondary | ICD-10-CM | POA: Insufficient documentation

## 2013-05-25 DIAGNOSIS — R69 Illness, unspecified: Secondary | ICD-10-CM

## 2013-05-25 DIAGNOSIS — J111 Influenza due to unidentified influenza virus with other respiratory manifestations: Secondary | ICD-10-CM | POA: Insufficient documentation

## 2013-05-25 LAB — URINALYSIS, ROUTINE W REFLEX MICROSCOPIC
Bilirubin Urine: NEGATIVE
GLUCOSE, UA: NEGATIVE mg/dL
KETONES UR: NEGATIVE mg/dL
NITRITE: POSITIVE — AB
PROTEIN: NEGATIVE mg/dL
Specific Gravity, Urine: 1.008 (ref 1.005–1.030)
Urobilinogen, UA: 0.2 mg/dL (ref 0.0–1.0)
pH: 8.5 — ABNORMAL HIGH (ref 5.0–8.0)

## 2013-05-25 LAB — COMPREHENSIVE METABOLIC PANEL
ALBUMIN: 3 g/dL — AB (ref 3.5–5.2)
ALK PHOS: 86 U/L (ref 39–117)
ALT: 10 U/L (ref 0–35)
AST: 12 U/L (ref 0–37)
BUN: 11 mg/dL (ref 6–23)
CHLORIDE: 107 meq/L (ref 96–112)
CO2: 22 mEq/L (ref 19–32)
Calcium: 8.4 mg/dL (ref 8.4–10.5)
Creatinine, Ser: 0.7 mg/dL (ref 0.50–1.10)
GFR calc Af Amer: >90 mL/min (ref >90)
GFR calc non Af Amer: >90 mL/min (ref >90)
Glucose, Bld: 93 mg/dL (ref 70–99)
POTASSIUM: 4.1 meq/L (ref 3.7–5.3)
Sodium: 142 mEq/L (ref 137–147)
TOTAL PROTEIN: 6.5 g/dL (ref 6.0–8.3)

## 2013-05-25 LAB — CBC
HCT: 26.4 % — ABNORMAL LOW (ref 36.0–46.0)
Hemoglobin: 7.9 g/dL — ABNORMAL LOW (ref 12.0–15.0)
MCH: 20.4 pg — AB (ref 26.0–34.0)
MCHC: 29.9 g/dL — AB (ref 30.0–36.0)
MCV: 68.2 fL — ABNORMAL LOW (ref 78.0–100.0)
PLATELETS: 384 10*3/uL (ref 150–400)
RBC: 3.87 MIL/uL (ref 3.87–5.11)
RDW: 20.1 % — ABNORMAL HIGH (ref 11.5–15.5)
WBC: 8.8 10*3/uL (ref 4.0–10.5)

## 2013-05-25 LAB — URINE MICROSCOPIC-ADD ON

## 2013-05-25 LAB — CG4 I-STAT (LACTIC ACID): Lactic Acid, Venous: 1.87 mmol/L (ref 0.5–2.2)

## 2013-05-25 MED ORDER — HYDROMORPHONE HCL PF 1 MG/ML IJ SOLN
1.0000 mg | Freq: Once | INTRAMUSCULAR | Status: AC
  Administered 2013-05-25: 1 mg via INTRAVENOUS
  Filled 2013-05-25: qty 1

## 2013-05-25 MED ORDER — CIPROFLOXACIN HCL 500 MG PO TABS: 500.0000 mg | ORAL_TABLET | Freq: Two times a day (BID) | ORAL | Status: DC

## 2013-05-25 MED ORDER — DEXTROSE 5 % IV SOLN
1.0000 g | Freq: Once | INTRAVENOUS | Status: AC
  Administered 2013-05-25: 1 g via INTRAVENOUS

## 2013-05-25 MED ORDER — MORPHINE SULFATE 4 MG/ML IJ SOLN
4.0000 mg | Freq: Once | INTRAMUSCULAR | Status: AC
  Administered 2013-05-25: 4 mg via INTRAVENOUS
  Filled 2013-05-25: qty 1

## 2013-05-25 MED ORDER — CEFTRIAXONE SODIUM 1 G IJ SOLR
INTRAMUSCULAR | Status: AC
  Filled 2013-05-25: qty 10

## 2013-05-25 MED ORDER — SODIUM CHLORIDE 0.9 % IV BOLUS (SEPSIS)
1000.0000 mL | Freq: Once | INTRAVENOUS | Status: AC
  Administered 2013-05-25: 1000 mL via INTRAVENOUS

## 2013-05-25 MED ORDER — HYDROCODONE-ACETAMINOPHEN 5-325 MG PO TABS: 1.0000 | ORAL_TABLET | Freq: Four times a day (QID) | ORAL | Status: DC | PRN

## 2013-05-25 NOTE — ED Provider Notes (Signed)
CSN: [id number]     Arrival date & time 05/25/13  [id number] History   First MD Initiated Contact with Patient 05/25/13 1035     Chief Complaint  Patient presents with  . Fever  . Cough  . Otalgia   (Consider location/radiation/quality/duration/timing/severity/associated sxs/prior Treatment) Patient is a 43 y.o. female presenting with abdominal pain. The history is provided by the patient.  Abdominal Pain Pain location:  R flank Pain quality: aching   Pain radiates to:  Does not radiate Pain severity:  Mild Onset quality:  Gradual Duration:  2 days Timing:  Intermittent Progression:  Worsening Chronicity:  New Context: not recent illness and not sick contacts   Relieved by:  Nothing Worsened by:  Nothing tried Associated symptoms: fever and sore throat   Associated symptoms: no chest pain, no chills, no cough, no shortness of breath and no vomiting     Past Medical History  Diagnosis Date  . Anemia   . GERD (gastroesophageal reflux disease)    Past Surgical History  Procedure Laterality Date  . Back surgery      04/2013   No family history on file. History  Substance Use Topics  . Smoking status: Current Every Day Smoker -- 1.00 packs/day for 20 years  . Smokeless tobacco: Not on file  . Alcohol Use: No   OB History   Grav Para Term Preterm Abortions TAB SAB Ect Mult Living                 Review of Systems  Constitutional: Positive for fever. Negative for chills.  HENT: Positive for postnasal drip and sore throat.   Respiratory: Negative for cough and shortness of breath.   Cardiovascular: Negative for chest pain and leg swelling.  Gastrointestinal: Negative for vomiting and abdominal pain.  All other systems reviewed and are negative.    Allergies  Review of patient's allergies indicates no known allergies.  Home Medications   Current Outpatient Rx  Name  Route  Sig  Dispense  Refill  . cyclobenzaprine (FLEXERIL) 10 MG tablet   Oral   Take 10 mg by  mouth 3 (three) times daily as needed for muscle spasms.         . Oxycodone HCl 10 MG TABS   Oral   Take 10 mg by mouth 4 (four) times daily as needed.         . ranitidine (ZANTAC) 150 MG tablet   Oral   Take 150 mg by mouth daily.         . ferrous sulfate 325 (65 FE) MG tablet   Oral   Take 650 mg by mouth daily with breakfast.          BP 98/54  Pulse 91  Temp(Src) 98.3 F (36.8 C) (Oral)  Resp 16  Ht 5\' 8"  (1.727 m)  Wt 135 lb (61.236 kg)  BMI 20.53 kg/m2  SpO2 98%  LMP 05/23/2013 Physical Exam  Nursing note and vitals reviewed. Constitutional: She is oriented to person, place, and time. She appears well-developed and well-nourished. No distress.  HENT:  Head: Normocephalic and atraumatic.  Eyes: EOM are normal. Pupils are equal, round, and reactive to light.  Neck: Normal range of motion. Neck supple.  Cardiovascular: Normal rate and regular rhythm.  Exam reveals no friction rub.   No murmur heard. Pulmonary/Chest: Effort normal and breath sounds normal. No respiratory distress. She has no wheezes. She has no rales.  Abdominal: Soft. She exhibits no distension. There  is tenderness (mild, R flank). There is no rebound.  Musculoskeletal: Normal range of motion. She exhibits no edema.  Neurological: She is alert and oriented to person, place, and time. No cranial nerve deficit. She exhibits normal muscle tone. Coordination normal.  Skin: No rash noted. She is not diaphoretic.    ED Course  Procedures (including critical care time) Labs Review Labs Reviewed  CBC - Abnormal; Notable for the following:    Hemoglobin 7.9 (*)    HCT 26.4 (*)    MCV 68.2 (*)    MCH 20.4 (*)    MCHC 29.9 (*)    RDW 20.1 (*)    All other components within normal limits  URINALYSIS, ROUTINE W REFLEX MICROSCOPIC - Abnormal; Notable for the following:    APPearance CLOUDY (*)    pH 8.5 (*)    Hgb urine dipstick LARGE (*)    Nitrite POSITIVE (*)    Leukocytes, UA LARGE (*)     All other components within normal limits  URINE MICROSCOPIC-ADD ON - Abnormal; Notable for the following:    Bacteria, UA FEW (*)    All other components within normal limits  COMPREHENSIVE METABOLIC PANEL - Abnormal; Notable for the following:    Albumin 3.0 (*)    Total Bilirubin <0.2 (*)    All other components within normal limits  URINE CULTURE  CG4 I-STAT (LACTIC ACID)   Imaging Review No results found.  EKG Interpretation   None       MDM   1. UTI (urinary tract infection)   2. Influenza-like illness    SUBJECTIVE:  TAYLA PANOZZO is a 43 y.o. female who present complaining of flu-like symptoms: fevers, chills, myalgias, congestion, otalgia, sore throat and cough for 4 days. Denies dyspnea or wheezing.  OBJECTIVE: Appears moderately ill but not toxic; temperature as noted in vitals. Ears normal. Throat and pharynx normal.  Neck supple. No adenopathy in the neck. Sinuses non tender. The chest is clear. Soft BP on initial BP. Patient has no abdominal pain when distracted, but when she is not distracting, she has R flank pain. No guarding or rebound. Will check labs. Strong family history of kidney stones, so low threshold to scan.   ASSESSMENT: Influenza-like illness, R flank pain. She is easily distractable and has no pain while watching TV.  PLAN: UTI found on UA. reat UTI with Rocephin here. Patient has R flank pain, will CT to r/o stone. Pain meds given.     Dagmar Hait, MD 05/25/13 [phone]

## 2013-05-25 NOTE — Discharge Instructions (Signed)
Urinary Tract Infection °Urinary tract infections (UTIs) can develop anywhere along your urinary tract. Your urinary tract is your body's drainage system for removing wastes and extra water. Your urinary tract includes two kidneys, two ureters, a bladder, and a urethra. Your kidneys are a pair of bean-shaped organs. Each kidney is about the size of your fist. They are located below your ribs, one on each side of your spine. °CAUSES °Infections are caused by microbes, which are microscopic organisms, including fungi, viruses, and bacteria. These organisms are so small that they can only be seen through a microscope. Bacteria are the microbes that most commonly cause UTIs. °SYMPTOMS  °Symptoms of UTIs may vary by age and gender of the patient and by the location of the infection. Symptoms in young women typically include a frequent and intense urge to urinate and a painful, burning feeling in the bladder or urethra during urination. Older women and men are more likely to be tired, shaky, and weak and have muscle aches and abdominal pain. A fever may mean the infection is in your kidneys. Other symptoms of a kidney infection include pain in your back or sides below the ribs, nausea, and vomiting. °DIAGNOSIS °To diagnose a UTI, your caregiver will ask you about your symptoms. Your caregiver also will ask to provide a urine sample. The urine sample will be tested for bacteria and white blood cells. White blood cells are made by your body to help fight infection. °TREATMENT  °Typically, UTIs can be treated with medication. Because most UTIs are caused by a bacterial infection, they usually can be treated with the use of antibiotics. The choice of antibiotic and length of treatment depend on your symptoms and the type of bacteria causing your infection. °HOME CARE INSTRUCTIONS °· If you were prescribed antibiotics, take them exactly as your caregiver instructs you. Finish the medication even if you feel better after you  have only taken some of the medication. °· Drink enough water and fluids to keep your urine clear or pale yellow. °· Avoid caffeine, tea, and carbonated beverages. They tend to irritate your bladder. °· Empty your bladder often. Avoid holding urine for long periods of time. °· Empty your bladder before and after sexual intercourse. °· After a bowel movement, women should cleanse from front to back. Use each tissue only once. °SEEK MEDICAL CARE IF:  °· You have back pain. °· You develop a fever. °· Your symptoms do not begin to resolve within 3 days. °SEEK IMMEDIATE MEDICAL CARE IF:  °· You have severe back pain or lower abdominal pain. °· You develop chills. °· You have nausea or vomiting. °· You have continued burning or discomfort with urination. °MAKE SURE YOU:  °· Understand these instructions. °· Will watch your condition. °· Will get help right away if you are not doing well or get worse. °Document Released: 02/15/2005 Document Revised: 11/07/2011 Document Reviewed: 06/16/2011 °ExitCare® Patient Information ©2014 [employer]. ° °Emergency Department Resource Guide °1) Find a Doctor and Pay Out of Pocket °Although you won't have to find out who is covered by your insurance plan, it is a good idea to ask around and get recommendations. You will then need to call the office and see if the doctor you have chosen will accept you as a new patient and what types of options they offer for patients who are self-pay. Some doctors offer discounts or will set up payment plans for their patients who do not have insurance, but you will need   to ask so you aren't surprised when you get to your appointment. ° °2) Contact Your Local Health Department °Not all health departments have doctors that can see patients for sick visits, but many do, so it is worth a call to see if yours does. If you don't know where your local health department is, you can check in your phone book. The CDC also has a tool to help you locate your  state's health department, and many state websites also have listings of all of their local health departments. ° °3) Find a Walk-in Clinic °If your illness is not likely to be very severe or complicated, you may want to try a walk in clinic. These are popping up all over the country in pharmacies, drugstores, and shopping centers. They're usually staffed by nurse practitioners or physician assistants that have been trained to treat common illnesses and complaints. They're usually fairly quick and inexpensive. However, if you have serious medical issues or chronic medical problems, these are probably not your best option. ° °No Primary Care Doctor: °- Call [facility] at  832-8000 - they can help you locate a primary care doctor that  accepts your insurance, provides certain services, etc. °- Physician Referral Service- 1-800-533-3463 ° °Chronic Pain Problems: °Organization         Address  Phone   Notes  °[facility]  (336) 297-2271 Patients need to be referred by their primary care doctor.  ° °Medication Assistance: °Organization         Address  Phone   Notes  °[place] [facility] [address] °[place], [place] [address] (336) 641-8030 --Must be a resident of [place] °-- Must have NO insurance coverage whatsoever (no Medicaid/ Medicare, etc.) °-- The pt. MUST have a primary care doctor that directs their care regularly and follows them in the community °  °[facility]  (866) 331-1348   °[employer]  (888) 892-1162   ° °Agencies that provide inexpensive medical care: °Organization         Address  Phone   Notes  °[facility]  (336) 832-8035   °[facility]    (336) 832-7272   °[facility] [address] °[place], [place] [address] (336) 832-4777   °[facility] [address], °[place] (336) 271-4999   °[facility]    (336) 373-0678   °[facility]    (336)  272-1050   °[facility] ° [address], [place] Phone:  (336) 832-4444, Fax:  (336) 832-4440 Hours of Operation:  9 am - 6 pm, M-F.  Also accepts Medicaid/Medicare and self-pay.  °[facility] ° [address], [place] Phone: (336) 832-3150, Fax: (336) 832-3151. Hours of Operation:  8:30 am - 5:30 pm, M-F.  Also accepts Medicaid and self-pay.  °[facility] [place] [address], [place] Phone: (336) 878-6027   °[facility] [address], [place], [place] (336)723-1848, Ext. 123 Mondays & Thursdays: 7-9 AM.  First 15 patients are seen on a first come, first serve basis. °  ° °Medicaid-accepting [place] Providers: ° °Organization         Address  Phone   Notes  °[facility] [address], [place] (336) 641-2100 Also accepts self-pay patients.  °[facility] [address], [place] ° (  336) 856-9996   °[facility] [address], [place] (336) 288-8857   °[facility] [address] [place] [address], [place] (336) 299-7000   °Veita Bland [address], [place]  ° (336) 373-1557 Only accepts [place] Access Medicaid patients after they have their name applied to their card.  ° °Self-Pay (no insurance) in [place]: ° °Organization         Address  Phone   Notes  °Sickle Cell Patients, [facility] [address], [place] (336) 832-1970   °[facility] [address], [place] (336) 832-4400   °[facility] [place] ° [address], [place] (336) 992-4800   °[facility]/Dr. Osei-Bonsu ° [address] [place] [address], [place] or [address], [place] (336) 841-8500 Phone number for both [place] and [place] locations is the same.  °[facility] [address], [place] (336)  299-0000   °[facility] [address] [place] [address], [place] or [address] (336) 852-7530 °(336) 878-2260   °[facility] [address], [place] (336) 350-1642, phone; (336) 294-5005, fax Sees patients 1st and 3rd Saturday of every month.  Must not qualify for public or private insurance (i.e. Medicaid, Medicare, [place] Health Choice, Veterans' Benefits) • Household income should be no more than 200% of the poverty level •The clinic cannot treat you if you are pregnant or think you are pregnant • Sexually transmitted diseases are not treated at the clinic.  ° ° °Dental Care: °Organization         Address  Phone  Notes  °[facility] [facility] [address], [place] (336) 641-6152 Accepts children up to age 21 who are enrolled in Medicaid or [place] Health Choice; pregnant women with a Medicaid card; and children who have applied for Medicaid or [place] Health Choice, but were declined, whose parents can pay a reduced fee at time of service.  °[facility] [place]  [address], [place] (336) 641-7733 Accepts children up to age 21 who are enrolled in Medicaid or [place] Health Choice; pregnant women with a Medicaid card; and children who have applied for Medicaid or [place] Health Choice, but were declined, whose parents can pay a reduced fee at time of service.  °[facility] ° [address], [place] (336) 641-4533 Patients are seen by appointment only. Walk-ins are not accepted. [facility] will see patients 18 years of age and older. °Monday - Tuesday (8am-5pm) °Most Wednesdays (8:30-5pm) °$30 per visit, cash only  °[facility] ° [address], [place] (336) 641-4533 Patients are seen by appointment only. Walk-ins are not accepted. [facility] will see patients 18 years of age and older. °One Wednesday Evening (Monthly: Volunteer  Based).  $30 per visit, cash only  °[facility]  (919) 537-3737 for adults; Children under age 4, call Graduate Pediatric Dentistry at (919) [phone]. Children aged 4-14, please call (919) [phone] to request a pediatric application. ° Dental services are provided in all areas of dental care including fillings, crowns and bridges, complete and partial dentures, implants, gum treatment, root canals, and extractions. Preventive care is also provided. Treatment is provided to both adults and children. °Patients are selected via a lottery and there is   often a waiting list. °  °[facility] [address], °[place] ° (336) 763-8833 [email or web address] °  °[facility] [address], [place], [place] (336)723-1848, Ext. 123 Second and Fourth Thursday of each month, opens at 6:30 AM; Clinic ends at 9 AM.  Patients are seen on a first-come first-served basis, and a limited number are seen during each clinic.  ° °[facility] ° [address], [place], [place] (336) 723-7904   Eligibility Requirements °You must have lived in [place], [place], or [place] counties for at least the last three months. °  You cannot be eligible for state or federal sponsored healthcare insurance, including Veterans Administration, Medicaid, or Medicare. °  You generally cannot be eligible for healthcare insurance through your employer.  °  How to apply: °Eligibility screenings are held every Tuesday and Wednesday afternoon from 1:00 pm until 4:00 pm. You do not need an appointment for the interview!  °[facility] [address], [place], [place] 336-631-2330   °[facility]  336-342-8273   °[place] [facility]  336-703-3100   °[facility]  336-570-6415   ° °Behavioral Health Resources in the Community: °Intensive Outpatient Programs °Organization         Address  Phone  Notes  °[facility] [address], [place], [place] 336-878-6098   °[facility] [address], [place], [place] 336-832-9800   °[facility] [address], [place], [place] ° 336-882-2125   °[facility] [address],  °[place], [place] 1-800-853-5163 or 336-641-4981   °Substance Abuse Resources °Organization         Address  Phone  Notes  °[facility]  336-882-2125   °[facility]  336-784-9470   °[facility]  336-285-9073   °[facility]  336-845-3988   °Residential & Outpatient Substance Abuse Program  1-800-659-3381   °Psychological Services °Organization         Address  Phone  Notes  °[facility]  336- 832-9600   °[facility]  336- 378-7881   °[facility] [address], [place] 1-800-853-5163 or 336-641-4981   ° °Mobile Crisis Teams °Organization         Address  Phone  Notes  °[facility], Mobile Crisis Care Unit  1-877-626-1772   °Assertive °[facility] ° [address] [place], [place] 336-834-9664   °Sharon DeEsch [address] °[place] 336-554-5454   ° °Self-Help/Support Groups °Organization         Address  Phone             Notes  °[facility] [place] - variety of support groups  336- 373-1402 Call for more information  °Narcotics Anonymous (NA), [facility] [address], °[place]  2 meetings at this location  ° °Residential Treatment Programs °Organization         Address  Phone  Notes  °[facility] [address],    °[place]  1-866-801-8205   °[facility] ° [address], [place], [place] 704-293-8524   °[facility] [address], [place] 336-845-3988 Admissions: 8am-3pm M-F  °[facility] [address]    °[place], [place] 336-841-1104   °[facility] [address], [place], [place] 336-379-7146    °  [facility] [address]  °[place], [place] 336-285-9073   °[facility] Intensive Outpatient [address], [place], [place] 336-852-3033   °[facility] [address]  °[place], [place] 1-877-615-2722 or 336-784-9470   °[facility] [address] [place], [place] 336-227-7417 Accepts Medicaid  °[facility] [address]  ° [place] 1-800-659-3381 Substance Abuse/Addiction Treatment  ° °[place] Behavioral Health Resources °Organization         Address  Phone  Notes  °[facility]  (888) 581-9988   °Julie Brannon, PhD [address] [place], [place]   (336) 349-5553 or (336) 951-0000   °[facility]   [address] °[place], [place] (336) 349-4454   °[facility] [address], [place], [place] (336) 342-8316 Insurance/Medicaid/sponsorship through [facility]  °[facility] [address]                                    [place], [place] (336) 342-8316 Therapy/tele-psych/case  °[facility] [address]  ° [place], [place] (336) 349-2233    °Dr. Arfeen  (336) 349-4544   °[facility] [place]  [employer] [place] [facility] 1) [address], [place] °2) [address], [place] °3)  [address], [place] (336) 349-3220 °(336) 342-7768 ° °(336) 342-8140   °[place] [facility] (336) 342-1394 or (336) 342-3537 (After Hours)    ° ° ° °

## 2013-05-25 NOTE — ED Notes (Signed)
Fever, sore throat, cough, ear ache, cloudy urine, right rib pain with cough, for three days.

## 2013-05-27 LAB — URINE CULTURE: Colony Count: >=100000

## 2016-03-04 ENCOUNTER — Emergency Department (HOSPITAL_BASED_OUTPATIENT_CLINIC_OR_DEPARTMENT_OTHER): Payer: Medicaid Other

## 2016-03-04 ENCOUNTER — Emergency Department (HOSPITAL_BASED_OUTPATIENT_CLINIC_OR_DEPARTMENT_OTHER)
Admission: EM | Admit: 2016-03-04 | Discharge: 2016-03-04 | Disposition: A | Discharge status: Home / Self Care | Payer: Medicaid Other | Attending: Emergency Medicine | Admitting: Emergency Medicine

## 2016-03-04 ENCOUNTER — Encounter (HOSPITAL_BASED_OUTPATIENT_CLINIC_OR_DEPARTMENT_OTHER): Payer: Self-pay | Admitting: *Deleted

## 2016-03-04 DIAGNOSIS — R05 Cough: Secondary | ICD-10-CM

## 2016-03-04 DIAGNOSIS — F17210 Nicotine dependence, cigarettes, uncomplicated: Secondary | ICD-10-CM | POA: Insufficient documentation

## 2016-03-04 DIAGNOSIS — R059 Cough, unspecified: Secondary | ICD-10-CM

## 2016-03-04 DIAGNOSIS — J069 Acute upper respiratory infection, unspecified: Secondary | ICD-10-CM | POA: Diagnosis not present

## 2016-03-04 MED ORDER — AZITHROMYCIN 250 MG PO TABS: 250.0000 mg | ORAL_TABLET | Freq: Every day | ORAL | 0 refills | Status: AC

## 2016-03-04 MED ORDER — IPRATROPIUM-ALBUTEROL 0.5-2.5 (3) MG/3ML IN SOLN
RESPIRATORY_TRACT | Status: AC
  Administered 2016-03-04: 3 mL
  Filled 2016-03-04: qty 3

## 2016-03-04 MED ORDER — ALBUTEROL SULFATE (2.5 MG/3ML) 0.083% IN NEBU
INHALATION_SOLUTION | RESPIRATORY_TRACT | Status: AC
  Administered 2016-03-04: 2.5 mg
  Filled 2016-03-04: qty 3

## 2016-03-04 MED ORDER — DEXAMETHASONE SODIUM PHOSPHATE 10 MG/ML IJ SOLN
10.0000 mg | Freq: Once | INTRAMUSCULAR | Status: AC
  Administered 2016-03-04: 10 mg via INTRAMUSCULAR
  Filled 2016-03-04: qty 1

## 2016-03-04 MED ORDER — BENZONATATE 100 MG PO CAPS: 100.0000 mg | ORAL_CAPSULE | Freq: Three times a day (TID) | ORAL | 0 refills | Status: AC | PRN

## 2016-03-04 NOTE — ED Provider Notes (Signed)
[facility] Provider Note   CSN: [id number] Arrival date & time: 03/04/16  1143     History   Chief Complaint Chief Complaint  Patient presents with  . URI    HPI Brenda Chen is a 45 y.o. female.  The history is provided by the patient and medical records. No language interpreter was used.   Brenda Chen is a 45 y.o. female  with a PMH of GERD who presents to the Emergency Department complaining of constant worsening congestion, productive cough and rhinorrhea 9-10 days. Patient is a daily smoker, but states she has not smoked in the last week secondary to cough and  congestion. She has tried over-the-counter Zyrtec and cough syrup with no relief. She endorses muscle soreness of her lower chest and back which she believes is from cough. Denies chest pain, shortness of breath, sore throat, dysuria, abdominal pain, n/v.   Past Medical History:  Diagnosis Date  . Anemia   . GERD (gastroesophageal reflux disease)     There are no active problems to display for this patient.   Past Surgical History:  Procedure Laterality Date  . ABDOMINAL HYSTERECTOMY    . BACK SURGERY     04/2013    OB History    No data available       Home Medications    Prior to Admission medications   Medication Sig Start Date End Date Taking? Authorizing Provider  azithromycin (ZITHROMAX) 250 MG tablet Take 1 tablet (250 mg total) by mouth daily. Take first 2 tablets together, then 1 every day until finished. 03/04/16   [place] Pilcher Jachelle Fluty, PA-C  benzonatate (TESSALON) 100 MG capsule Take 1 capsule (100 mg total) by mouth 3 (three) times daily as needed for cough. 03/04/16   Chase Picket Leilany Digeronimo, PA-C  cyclobenzaprine (FLEXERIL) 10 MG tablet Take 10 mg by mouth 3 (three) times daily as needed for muscle spasms.    Historical Provider, MD  ferrous sulfate 325 (65 FE) MG tablet Take 650 mg by mouth daily with breakfast.    Historical Provider, MD  Oxycodone HCl 10 MG TABS Take  10 mg by mouth 4 (four) times daily as needed.    Historical Provider, MD  ranitidine (ZANTAC) 150 MG tablet Take 150 mg by mouth daily.    Historical Provider, MD    Family History No family history on file.  Social History Social History  Substance Use Topics  . Smoking status: Current Every Day Smoker    Packs/day: 1.00    Years: 20.00    Types: E-cigarettes  . Smokeless tobacco: Not on file  . Alcohol use No     Allergies   Review of patient's allergies indicates no known allergies.   Review of Systems Review of Systems  Constitutional: Negative for fever.  HENT: Positive for congestion, postnasal drip and rhinorrhea. Negative for sore throat.   Respiratory: Positive for cough. Negative for shortness of breath.   Cardiovascular: Negative for chest pain.  Gastrointestinal: Negative for abdominal pain, nausea and vomiting.  Genitourinary: Negative for dysuria.     Physical Exam Updated Vital Signs BP (!) 123/106 (BP Location: Right Arm)   Pulse 67   Temp 98.1 F (36.7 C) (Oral)   Resp 20   Ht 5\' 8"  (1.727 m)   Wt 65.8 kg   LMP 05/23/2013   SpO2 100%   BMI 22.05 kg/m   Physical Exam  Constitutional: She is oriented to person, place, and time. She appears  well-developed and well-nourished. No distress.  HENT:  Head: Normocephalic and atraumatic.  OP with erythema, no exudates or tonsillar hypertrophy. + PND. + nasal congestion with mucosal edema.   Neck: Normal range of motion. Neck supple.  No meningeal signs.   Cardiovascular: Normal rate, regular rhythm and normal heart sounds.   Pulmonary/Chest: Effort normal.  Lungs are clear to auscultation bilaterally - no w/r/r  Abdominal: Soft. She exhibits no distension. There is no tenderness.  Neurological: She is alert and oriented to person, place, and time.  Skin: Skin is warm and dry. She is not diaphoretic.  Nursing note and vitals reviewed.    ED Treatments / Results  Labs (all labs ordered are  listed, but only abnormal results are displayed) Labs Reviewed - No data to display  EKG  EKG Interpretation None       Radiology Dg Chest 2 View  Result Date: 03/04/2016 CLINICAL DATA:  Cough.  URI symptoms for 2 weeks. EXAM: CHEST  2 VIEW COMPARISON:  None. FINDINGS: Heart size normal. The lungs are clear. The visualized soft tissues and bony thorax are unremarkable. IMPRESSION: Negative two view chest x-ray. Electronically Signed   By: Marin Robertshristopher  Mattern M.D.   On: 03/04/2016 12:49    Procedures Procedures (including critical care time)  Medications Ordered in ED Medications  albuterol (PROVENTIL) (2.5 MG/3ML) 0.083% nebulizer solution (2.5 mg  Given 03/04/16 1225)  ipratropium-albuterol (DUONEB) 0.5-2.5 (3) MG/3ML nebulizer solution (3 mLs  Given 03/04/16 1225)  dexamethasone (DECADRON) injection 10 mg (10 mg Intramuscular Given 03/04/16 1340)     Initial Impression / Assessment and Plan / ED Course  I have reviewed the triage vital signs and the nursing notes.  Pertinent labs & imaging results that were available during my care of the patient were reviewed by me and considered in my medical decision making (see chart for details).  Clinical Course   Brenda Chen is a 45 y.o. female who presents to ED for upper respiratory symptoms. Patient is a daily smoker who has now developed a productive cough with symptoms lasting 9-10 days. Chest x-ray unremarkable. Will treat with Zithromax. Decadron given in the ED as well. PCP follow-up strongly encouraged if symptoms are not improving. He since return to the ED discussed and all questions answered.  Final Clinical Impressions(s) / ED Diagnoses   Final diagnoses:  Cough  Upper respiratory tract infection, unspecified type    New Prescriptions Discharge Medication List as of 03/04/2016  1:45 PM    START taking these medications   Details  azithromycin (ZITHROMAX) 250 MG tablet Take 1 tablet (250 mg total) by mouth  daily. Take first 2 tablets together, then 1 every day until finished., Starting Sat 03/04/2016, Print    benzonatate (TESSALON) 100 MG capsule Take 1 capsule (100 mg total) by mouth 3 (three) times daily as needed for cough., Starting Sat 03/04/2016, Print         [facility] GroupJaime Pilcher Zalen Sequeira, PA-C 03/04/16 1405    Vanetta MuldersScott Zackowski, MD 03/05/16 [phone]

## 2016-03-04 NOTE — ED Triage Notes (Signed)
pt c/o URI symptoms x 2 weeks 

## 2016-03-04 NOTE — Discharge Instructions (Signed)
It was my pleasure taking care of you today!  Please take all of your antibiotics until finished! Tessalon as needed for cough. Follow-up with your primary care provider for discussion of today's diagnosis if symptoms do not improve over the next week. Return to ER for difficulty breathing, new or worsening symptoms, any additional concerns.

## 2016-08-13 ENCOUNTER — Emergency Department (HOSPITAL_BASED_OUTPATIENT_CLINIC_OR_DEPARTMENT_OTHER)
Admission: EM | Admit: 2016-08-13 | Discharge: 2016-08-13 | Disposition: A | Discharge status: Home / Self Care | Payer: Medicaid Other | Attending: Emergency Medicine | Admitting: Emergency Medicine

## 2016-08-13 ENCOUNTER — Encounter (HOSPITAL_BASED_OUTPATIENT_CLINIC_OR_DEPARTMENT_OTHER): Payer: Self-pay | Admitting: Emergency Medicine

## 2016-08-13 DIAGNOSIS — Y999 Unspecified external cause status: Secondary | ICD-10-CM | POA: Diagnosis not present

## 2016-08-13 DIAGNOSIS — W57XXXA Bitten or stung by nonvenomous insect and other nonvenomous arthropods, initial encounter: Secondary | ICD-10-CM | POA: Insufficient documentation

## 2016-08-13 DIAGNOSIS — Y939 Activity, unspecified: Secondary | ICD-10-CM | POA: Diagnosis not present

## 2016-08-13 DIAGNOSIS — S60562A Insect bite (nonvenomous) of left hand, initial encounter: Secondary | ICD-10-CM | POA: Diagnosis not present

## 2016-08-13 DIAGNOSIS — S30861A Insect bite (nonvenomous) of abdominal wall, initial encounter: Secondary | ICD-10-CM | POA: Insufficient documentation

## 2016-08-13 DIAGNOSIS — Y929 Unspecified place or not applicable: Secondary | ICD-10-CM | POA: Insufficient documentation

## 2016-08-13 DIAGNOSIS — F17210 Nicotine dependence, cigarettes, uncomplicated: Secondary | ICD-10-CM | POA: Insufficient documentation

## 2016-08-13 DIAGNOSIS — S60561A Insect bite (nonvenomous) of right hand, initial encounter: Secondary | ICD-10-CM | POA: Diagnosis not present

## 2016-08-13 MED ORDER — PERMETHRIN 5 % EX CREA: TOPICAL_CREAM | CUTANEOUS | 0 refills | Status: DC

## 2016-08-13 NOTE — ED Provider Notes (Signed)
[facility] Provider Note   CSN: [id number] Arrival date & time: 08/13/16  1338     History   Chief Complaint Chief Complaint  Patient presents with  . Rash    HPI Brenda Chen is a 46 y.o. female.  46 yo F with a chief complaint of an itchy rash. The started when she went to a hotel. She just returned this morning and thinks she has a bug infestation. She is concerned about scabies in particular. Does continue to find new lesions on her. Denies fevers or chills. Denies oral lesions.   The history is provided by the patient.  Rash   This is a new problem. The current episode started more than 2 days ago. The problem has been gradually worsening. The problem is associated with nothing. There has been no fever. The rash is present on the right arm, left arm, neck and torso. The pain is at a severity of 3/10. The pain is moderate. The pain has been constant since onset. Associated symptoms include itching. She has tried OTC analgesics and antihistamines for the symptoms. The treatment provided mild relief.    Past Medical History:  Diagnosis Date  . Anemia   . GERD (gastroesophageal reflux disease)     There are no active problems to display for this patient.   Past Surgical History:  Procedure Laterality Date  . ABDOMINAL HYSTERECTOMY    . BACK SURGERY     04/2013    OB History    No data available       Home Medications    Prior to Admission medications   Medication Sig Start Date End Date Taking? Authorizing Provider  azithromycin (ZITHROMAX) 250 MG tablet Take 1 tablet (250 mg total) by mouth daily. Take first 2 tablets together, then 1 every day until finished. 03/04/16   Jaime Pilcher Ward, PA-C  benzonatate (TESSALON) 100 MG capsule Take 1 capsule (100 mg total) by mouth 3 (three) times daily as needed for cough. 03/04/16   Chase Picket Ward, PA-C  cyclobenzaprine (FLEXERIL) 10 MG tablet Take 10 mg by mouth 3 (three) times daily as needed  for muscle spasms.    Historical Provider, MD  ferrous sulfate 325 (65 FE) MG tablet Take 650 mg by mouth daily with breakfast.    Historical Provider, MD  Oxycodone HCl 10 MG TABS Take 10 mg by mouth 4 (four) times daily as needed.    Historical Provider, MD  permethrin (ACTICIN) 5 % cream Apply cream from head to feet, leave on for 8 to 14 hours, then wash with soap/water, repeat application if living mites present 14 days after initial treatment 08/13/16   Melene Plan, DO  ranitidine (ZANTAC) 150 MG tablet Take 150 mg by mouth daily.    Historical Provider, MD    Family History History reviewed. No pertinent family history.  Social History Social History  Substance Use Topics  . Smoking status: Current Every Day Smoker    Packs/day: 1.00    Years: 20.00    Types: E-cigarettes  . Smokeless tobacco: Never Used  . Alcohol use No     Allergies   Patient has no known allergies.   Review of Systems Review of Systems  Constitutional: Negative for chills and fever.  HENT: Negative for congestion and rhinorrhea.   Eyes: Negative for redness and visual disturbance.  Respiratory: Negative for shortness of breath and wheezing.   Cardiovascular: Negative for chest pain and palpitations.  Gastrointestinal: Negative for  nausea and vomiting.  Genitourinary: Negative for dysuria and urgency.  Musculoskeletal: Negative for arthralgias and myalgias.  Skin: Positive for itching and rash. Negative for pallor and wound.  Neurological: Negative for dizziness and headaches.     Physical Exam Updated Vital Signs BP (!) 137/94 (BP Location: Right Arm)   Pulse 82   Temp 97.7 F (36.5 C) (Oral)   Resp 20   Ht 5\' 7"  (1.702 m)   Wt 150 lb (68 kg)   LMP 05/23/2013   SpO2 100%   BMI 23.49 kg/m   Physical Exam  Constitutional: She is oriented to person, place, and time. She appears well-developed and well-nourished. No distress.  HENT:  Head: Normocephalic and atraumatic.  Eyes: EOM are  normal. Pupils are equal, round, and reactive to light.  Neck: Normal range of motion. Neck supple.  Cardiovascular: Normal rate and regular rhythm.  Exam reveals no gallop and no friction rub.   No murmur heard. Pulmonary/Chest: Effort normal. She has no wheezes. She has no rales.  Abdominal: Soft. She exhibits no distension. There is no tenderness.  Musculoskeletal: She exhibits no edema or tenderness.  Neurological: She is alert and oriented to person, place, and time.  Skin: Skin is warm and dry. Rash noted. She is not diaphoretic.  Diffuse papular areas localized to upper extremities. Trace areas to the abdomen and in between the breasts. One lesion in between the fingers.  Psychiatric: She has a normal mood and affect. Her behavior is normal.  Nursing note and vitals reviewed.    ED Treatments / Results  Labs (all labs ordered are listed, but only abnormal results are displayed) Labs Reviewed - No data to display  EKG  EKG Interpretation None       Radiology No results found.  Procedures Procedures (including critical care time)  Medications Ordered in ED Medications - No data to display   Initial Impression / Assessment and Plan / ED Course  I have reviewed the triage vital signs and the nursing notes.  Pertinent labs & imaging results that were available during my care of the patient were reviewed by me and considered in my medical decision making (see chart for details).     46 yo F With a chief complaint of a rash. There are no linear distribution and there is only one in between the digits. I discussed with the patient that I'm not sure that this is scabies clinically. She is requesting treatment regardless. I suggested if she continues to have new bites she might need an exterminator. Discharge home.  2:48 PM:  I have discussed the diagnosis/risks/treatment options with the patient and family and believe the pt to be eligible for discharge home to follow-up  with PCP. We also discussed returning to the ED immediately if new or worsening sx occur. We discussed the sx which are most concerning (e.g., sudden worsening pain, fever, inability to tolerate by mouth) that necessitate immediate return. Medications administered to the patient during their visit and any new prescriptions provided to the patient are listed below.  Medications given during this visit Medications - No data to display   The patient appears reasonably screen and/or stabilized for discharge and I doubt any other medical condition or other [facility] requiring further screening, evaluation, or treatment in the ED at this time prior to discharge.    Final Clinical Impressions(s) / ED Diagnoses   Final diagnoses:  Insect bite, initial encounter    New Prescriptions Discharge Medication List  as of 08/13/2016  2:00 PM    START taking these medications   Details  permethrin (ACTICIN) 5 % cream Apply cream from head to feet, leave on for 8 to 14 hours, then wash with soap/water, repeat application if living mites present 14 days after initial treatment, Print         Melene Plan, DO 08/13/16 1448

## 2016-08-13 NOTE — ED Triage Notes (Addendum)
Patient reports she was out of town for work and thinks there were bugs in her room.  Reports itching all over since 3/16.  States she has been treating with tea tree oil with some relief.

## 2017-03-13 ENCOUNTER — Encounter (HOSPITAL_BASED_OUTPATIENT_CLINIC_OR_DEPARTMENT_OTHER): Payer: Self-pay | Admitting: *Deleted

## 2017-03-13 ENCOUNTER — Emergency Department (HOSPITAL_BASED_OUTPATIENT_CLINIC_OR_DEPARTMENT_OTHER)
Admission: EM | Admit: 2017-03-13 | Discharge: 2017-03-13 | Disposition: A | Discharge status: Home / Self Care | Payer: Medicaid Other | Attending: Emergency Medicine | Admitting: Emergency Medicine

## 2017-03-13 DIAGNOSIS — F17200 Nicotine dependence, unspecified, uncomplicated: Secondary | ICD-10-CM | POA: Insufficient documentation

## 2017-03-13 DIAGNOSIS — R21 Rash and other nonspecific skin eruption: Secondary | ICD-10-CM

## 2017-03-13 DIAGNOSIS — F17210 Nicotine dependence, cigarettes, uncomplicated: Secondary | ICD-10-CM | POA: Diagnosis not present

## 2017-03-13 DIAGNOSIS — Z79899 Other long term (current) drug therapy: Secondary | ICD-10-CM | POA: Diagnosis not present

## 2017-03-13 MED ORDER — HYDROXYZINE HCL 25 MG PO TABS: 25.0000 mg | ORAL_TABLET | Freq: Four times a day (QID) | ORAL | 0 refills | Status: AC | PRN

## 2017-03-13 NOTE — ED Provider Notes (Signed)
[facility] EMERGENCY DEPARTMENT Provider Note   CSN: [id number] Arrival date & time: 03/13/17  1057     History   Chief Complaint Chief Complaint  Patient presents with  . Rash    HPI Brenda Chen is a 46 y.o. female.  The history is provided by the patient. No language interpreter was used.   Brenda Chen is a 46 y.o. female who presents to the Emergency Department complaining of rash.  She reports one year of waxing and waning itchy rash.  It is located on her arms and buttocks and is patchy and scaly at times.  Her boyfriend has a similar rash on his genitals.  No known exposures or chemicals.  She has been seen by multiple providers and has been treated for scabies and ringworm.  She states the rash will begin to improve only to worsen again.  She has no known medical problems and reports feeling well otherwise.  She has children in the home and they do not have any sort of rash.   Past Medical History:  Diagnosis Date  . Anemia   . GERD (gastroesophageal reflux disease)     There are no active problems to display for this patient.   Past Surgical History:  Procedure Laterality Date  . ABDOMINAL HYSTERECTOMY    . BACK SURGERY     04/2013    OB History    No data available       Home Medications    Prior to Admission medications   Medication Sig Start Date End Date Taking? Authorizing Provider  azithromycin (ZITHROMAX) 250 MG tablet Take 1 tablet (250 mg total) by mouth daily. Take first 2 tablets together, then 1 every day until finished. 03/04/16   Ward, Chase PicketJaime Pilcher, PA-C  benzonatate (TESSALON) 100 MG capsule Take 1 capsule (100 mg total) by mouth 3 (three) times daily as needed for cough. 03/04/16   Ward, Chase PicketJaime Pilcher, PA-C  cyclobenzaprine (FLEXERIL) 10 MG tablet Take 10 mg by mouth 3 (three) times daily as needed for muscle spasms.    [provider]  ferrous sulfate 325 (65 FE) MG tablet Take 650 mg by mouth daily with  breakfast.    [provider]  hydrOXYzine (ATARAX/VISTARIL) 25 MG tablet Take 1 tablet (25 mg total) by mouth every 6 (six) hours as needed for itching. 03/13/17   Tilden Fossaees, Mirabel Ahlgren, MD  Oxycodone HCl 10 MG TABS Take 10 mg by mouth 4 (four) times daily as needed.    [provider]  permethrin (ACTICIN) 5 % cream Apply cream from head to feet, leave on for 8 to 14 hours, then wash with soap/water, repeat application if living mites present 14 days after initial treatment 08/13/16   Melene PlanFloyd, Dan, DO  ranitidine (ZANTAC) 150 MG tablet Take 150 mg by mouth daily.    [provider]    Family History No family history on file.  Social History Social History  Substance Use Topics  . Smoking status: Current Every Day Smoker    Packs/day: 1.00    Years: 20.00    Types: E-cigarettes  . Smokeless tobacco: Never Used  . Alcohol use No     Allergies   Patient has no known allergies.   Review of Systems Review of Systems  All other systems reviewed and are negative.    Physical Exam Updated Vital Signs BP (!) 129/107   Pulse 67   Temp 98.8 F (37.1 C) (Oral)   Resp  20   Ht 5\' 7"  (1.702 m)   Wt 68 kg (150 lb)   LMP 05/23/2013   SpO2 99%   BMI 23.49 kg/m   Physical Exam  Constitutional: She is oriented to person, place, and time. She appears well-developed and well-nourished. No distress.  HENT:  Head: Normocephalic and atraumatic.  Cardiovascular: Normal rate and regular rhythm.   Pulmonary/Chest: Effort normal. No respiratory distress.  Musculoskeletal: Normal range of motion.  Neurological: She is alert and oriented to person, place, and time.  Skin: Skin is warm and dry. Capillary refill takes less than 2 seconds.  Scattered small patches on bilateral arms, buttocks.  Minimal erythema and patches of dry skin.    Psychiatric: She has a normal mood and affect. Her behavior is normal.  Nursing note and vitals reviewed.    ED Treatments / Results    Labs (all labs ordered are listed, but only abnormal results are displayed) Labs Reviewed - No data to display  EKG  EKG Interpretation None       Radiology No results found.  Procedures Procedures (including critical care time)  Medications Ordered in ED Medications - No data to display   Initial Impression / Assessment and Plan / ED Course  I have reviewed the triage vital signs and the nursing notes.  Pertinent labs & imaging results that were available during my care of the patient were reviewed by me and considered in my medical decision making (see chart for details).     Pt here for evaluation of itchy rash for one year.  No systemic sxs. Exam is not c/w scabies, ringworm, bacterial infection.  D/w pt dermatology follow up for further evaluation.  Discussed hydrating lotions, atarax as needed for itching.  Discussed discontinuing home antifungal cream.    Final Clinical Impressions(s) / ED Diagnoses   Final diagnoses:  Rash    New Prescriptions Discharge Medication List as of 03/13/2017 12:44 PM    START taking these medications   Details  hydrOXYzine (ATARAX/VISTARIL) 25 MG tablet Take 1 tablet (25 mg total) by mouth every 6 (six) hours as needed for itching., Starting Tue 03/13/2017, Print         Tilden Fossa, MD 03/13/17 [id number]

## 2017-03-13 NOTE — ED Triage Notes (Signed)
Itching rash on her buttocks and arms on and off for a year. Her boyfriend has it too.

## 2017-03-13 NOTE — ED Notes (Signed)
ED Provider at bedside. 

## 2017-03-26 IMAGING — CR DG CHEST 2V
2 series · 2 of 2 positions shown · non-contrast
Comparison: None.

CLINICAL DATA: Cough.  URI symptoms for 2 weeks.

EXAM:
CHEST  2 VIEW

[w chest pa]
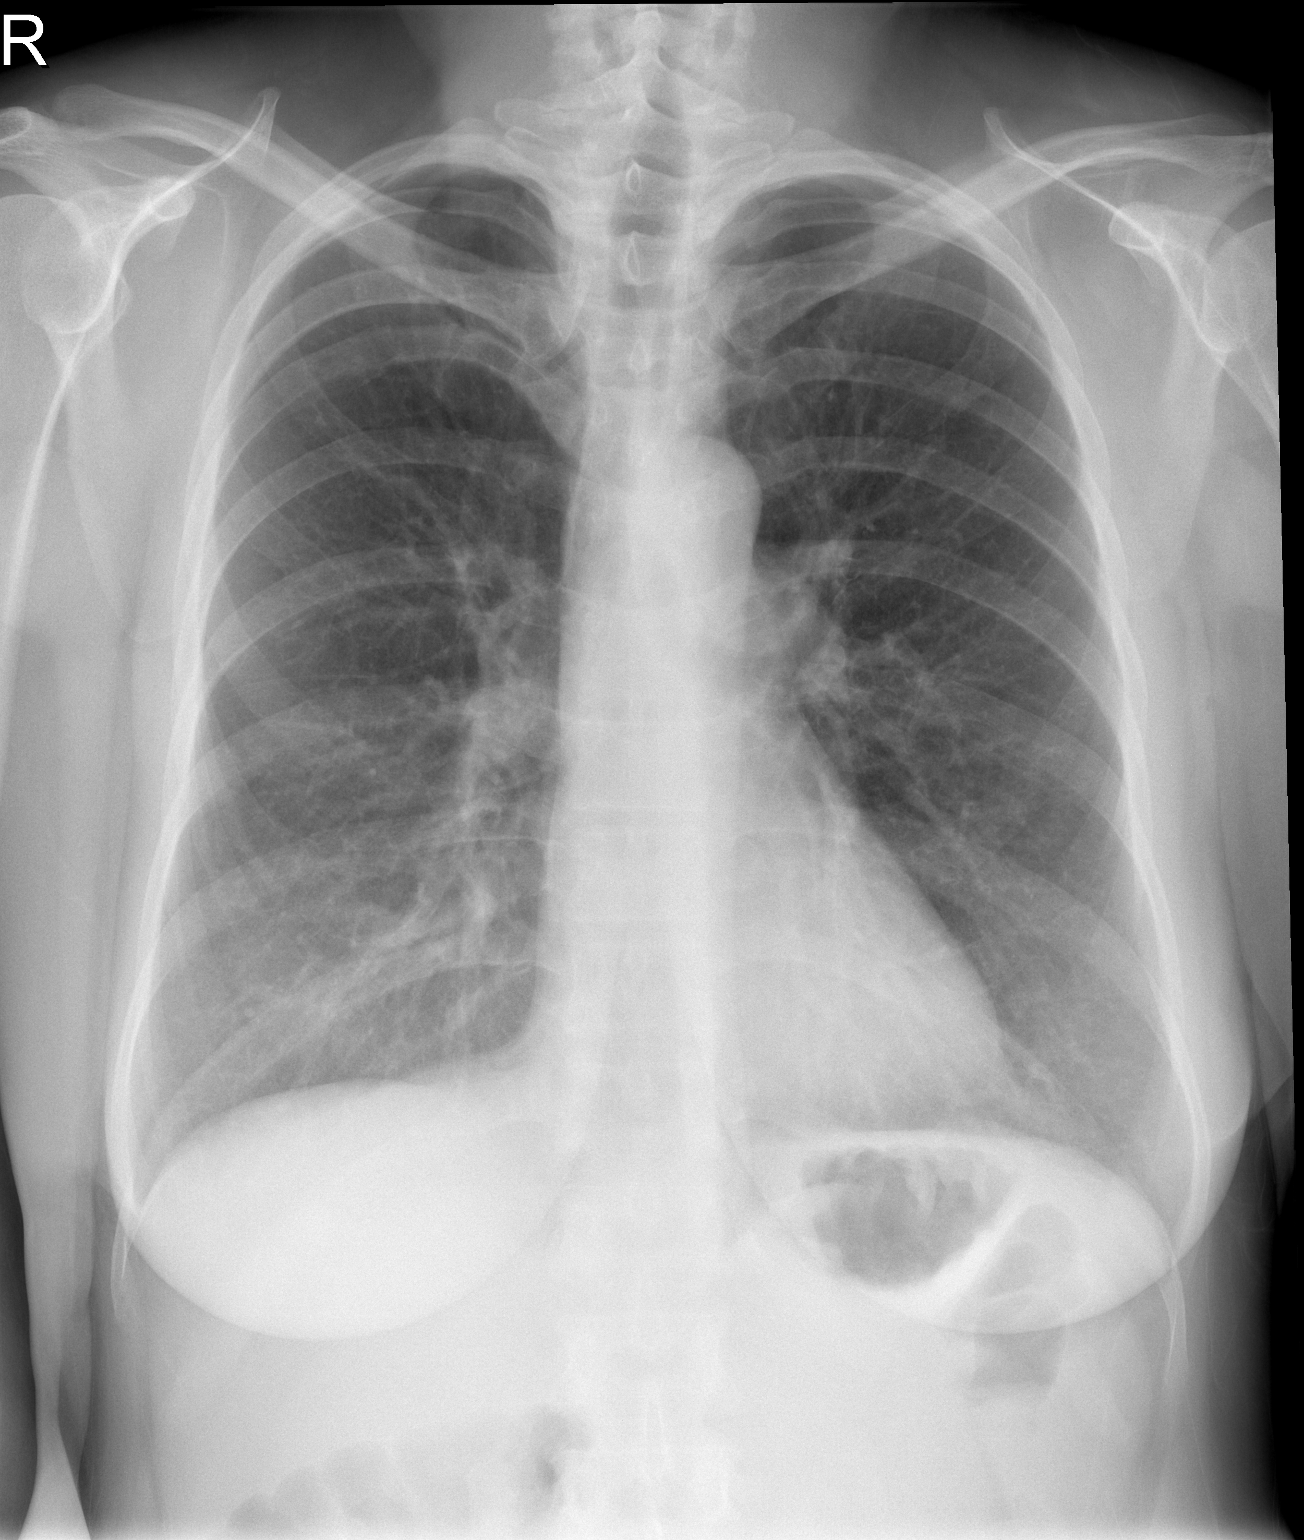

[w chest lat]
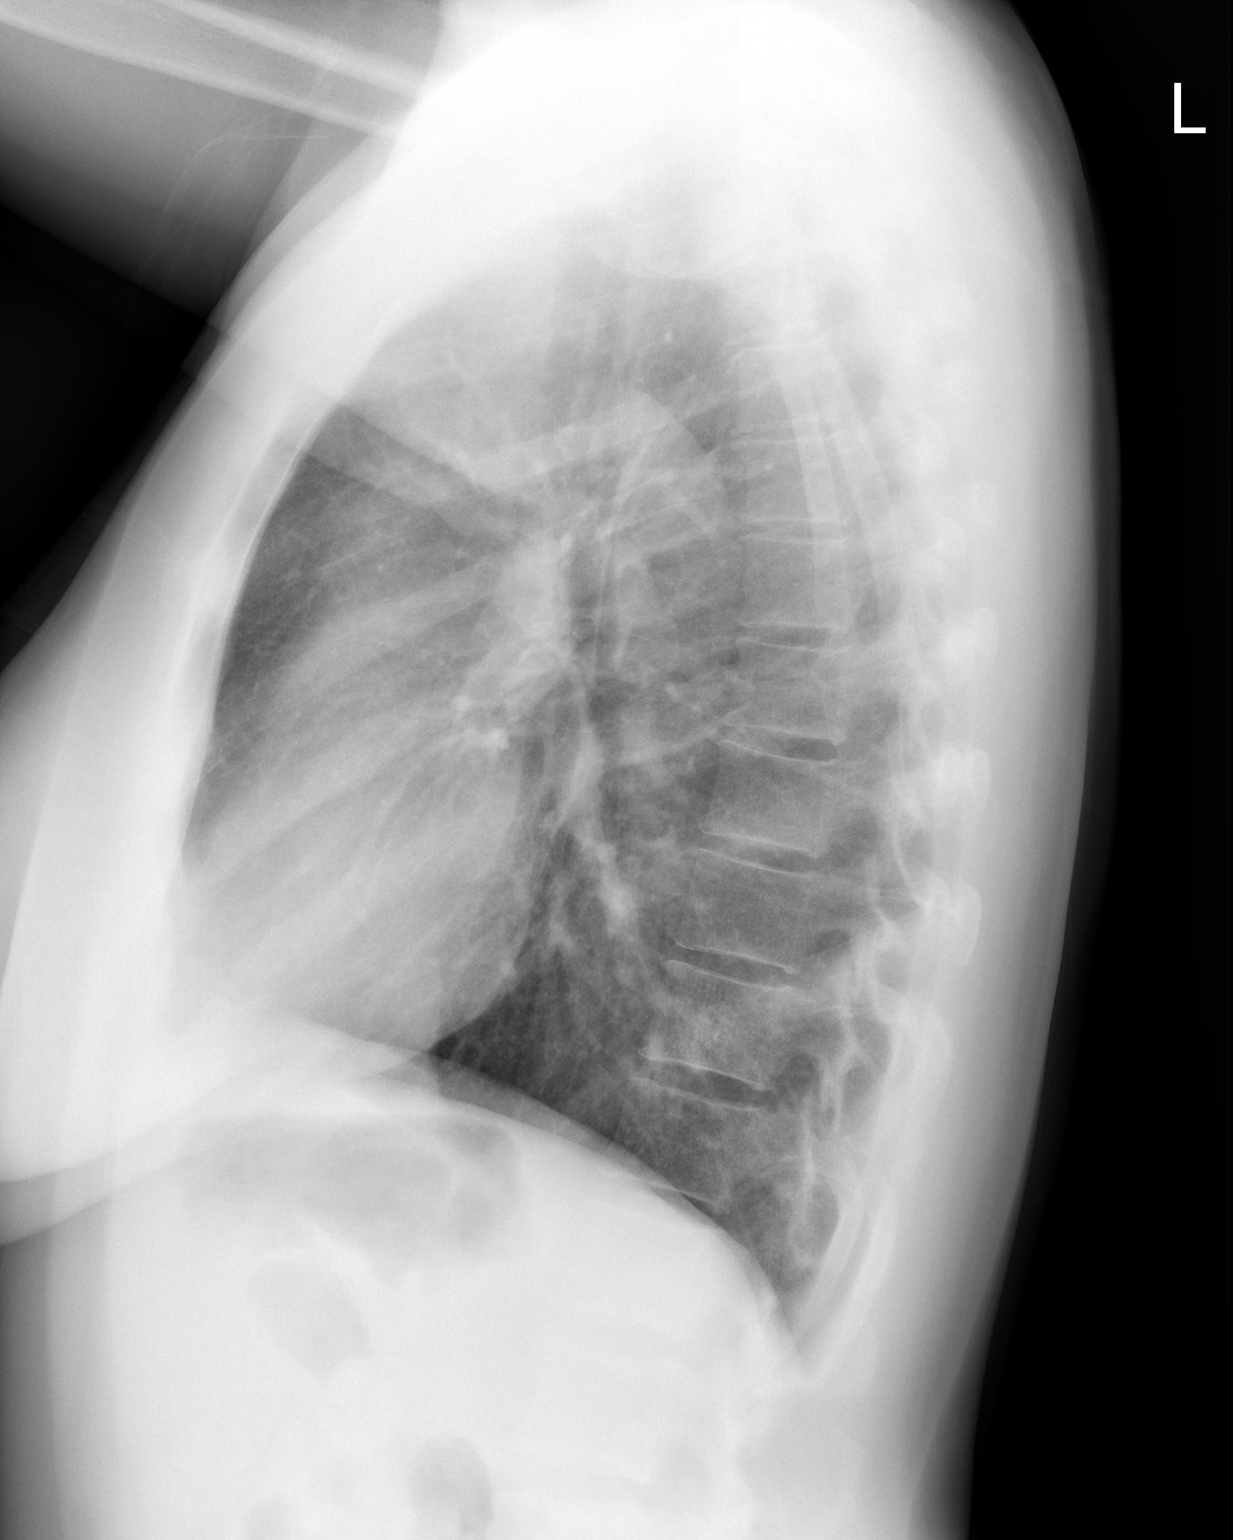

[2 of 2 positions shown; findings below may reference images not displayed]

FINDINGS: Heart size normal. The lungs are clear. The visualized soft tissues
and bony thorax are unremarkable.
IMPRESSION: Negative two view chest x-ray.

## 2017-06-24 ENCOUNTER — Other Ambulatory Visit: Payer: Self-pay

## 2017-06-24 ENCOUNTER — Emergency Department (HOSPITAL_BASED_OUTPATIENT_CLINIC_OR_DEPARTMENT_OTHER)
Admission: EM | Admit: 2017-06-24 | Discharge: 2017-06-24 | Disposition: A | Discharge status: Home / Self Care | Payer: Medicaid Other | Attending: Emergency Medicine | Admitting: Emergency Medicine

## 2017-06-24 ENCOUNTER — Encounter (HOSPITAL_BASED_OUTPATIENT_CLINIC_OR_DEPARTMENT_OTHER): Payer: Self-pay | Admitting: Emergency Medicine

## 2017-06-24 DIAGNOSIS — B86 Scabies: Secondary | ICD-10-CM | POA: Diagnosis not present

## 2017-06-24 DIAGNOSIS — B354 Tinea corporis: Secondary | ICD-10-CM | POA: Insufficient documentation

## 2017-06-24 DIAGNOSIS — F17290 Nicotine dependence, other tobacco product, uncomplicated: Secondary | ICD-10-CM | POA: Diagnosis not present

## 2017-06-24 DIAGNOSIS — F1210 Cannabis abuse, uncomplicated: Secondary | ICD-10-CM | POA: Insufficient documentation

## 2017-06-24 DIAGNOSIS — Z79899 Other long term (current) drug therapy: Secondary | ICD-10-CM | POA: Diagnosis not present

## 2017-06-24 DIAGNOSIS — R21 Rash and other nonspecific skin eruption: Secondary | ICD-10-CM | POA: Diagnosis present

## 2017-06-24 DIAGNOSIS — F17210 Nicotine dependence, cigarettes, uncomplicated: Secondary | ICD-10-CM | POA: Insufficient documentation

## 2017-06-24 MED ORDER — TERBINAFINE HCL 250 MG PO TABS: 250.0000 mg | ORAL_TABLET | Freq: Every day | ORAL | 0 refills | Status: AC

## 2017-06-24 MED ORDER — PERMETHRIN 5 % EX CREA: TOPICAL_CREAM | CUTANEOUS | 0 refills | Status: AC

## 2017-06-24 NOTE — ED Provider Notes (Signed)
[facility] EMERGENCY DEPARTMENT Provider Note   CSN: [id number] Arrival date & time: 06/24/17  1041     History   Chief Complaint Chief Complaint  Patient presents with  . Rash    HPI Brenda Chen is a 47 y.o. female.  HPI   47 year old female presents with concern for pruritic rash.  Reports that her boyfriend travels for work, and had returned home with a pruritic rash, which she has at this point.  Reports they have been previously treated for scabies, however they believe they are passing it back and forth to each other.  Reports areas of small red spots over her abdomen, back, and in between some of her fingers.  Reports she takes Benadryl with some relief of the pruritus.  Also reports that she had been diagnosed with ringworm, as was her boyfriend, and that she has continued spots concerning for ringworm over her abdomen and back.  She had previously taken oral medications, however symptoms have returned.  Denies fevers, cough, shortness of breath or other concerns.  Past Medical History:  Diagnosis Date  . Anemia   . GERD (gastroesophageal reflux disease)     There are no active problems to display for this patient.   Past Surgical History:  Procedure Laterality Date  . ABDOMINAL HYSTERECTOMY    . BACK SURGERY     04/2013    OB History    No data available       Home Medications    Prior to Admission medications   Medication Sig Start Date End Date Taking? Authorizing Provider  azithromycin (ZITHROMAX) 250 MG tablet Take 1 tablet (250 mg total) by mouth daily. Take first 2 tablets together, then 1 every day until finished. 03/04/16   Ward, Chase PicketJaime Pilcher, PA-C  benzonatate (TESSALON) 100 MG capsule Take 1 capsule (100 mg total) by mouth 3 (three) times daily as needed for cough. 03/04/16   Ward, Chase PicketJaime Pilcher, PA-C  cyclobenzaprine (FLEXERIL) 10 MG tablet Take 10 mg by mouth 3 (three) times daily as needed for muscle spasms.    [provider]  ferrous sulfate 325 (65 FE) MG tablet Take 650 mg by mouth daily with breakfast.    [provider]  hydrOXYzine (ATARAX/VISTARIL) 25 MG tablet Take 1 tablet (25 mg total) by mouth every 6 (six) hours as needed for itching. 03/13/17   Tilden Fossaees, Elizabeth, MD  Oxycodone HCl 10 MG TABS Take 10 mg by mouth 4 (four) times daily as needed.    [provider]  permethrin (ELIMITE) 5 % cream Apply to affected area once from neck down to feet, leave on for 8-14 hours. May reapply in 14 days. 06/24/17   Alvira MondaySchlossman, Renesha Lizama, MD  ranitidine (ZANTAC) 150 MG tablet Take 150 mg by mouth daily.    [provider]  terbinafine (LAMISIL) 250 MG tablet Take 1 tablet (250 mg total) by mouth daily for 14 days. 06/24/17 07/08/17  Alvira MondaySchlossman, Aryelle Figg, MD    Family History History reviewed. No pertinent family history.  Social History Social History   Tobacco Use  . Smoking status: Current Every Day Smoker    Packs/day: 1.00    Years: 20.00    Pack years: 20.00    Types: E-cigarettes  . Smokeless tobacco: Never Used  Substance Use Topics  . Alcohol use: No  . Drug use: Yes    Frequency: 7.0 times per week    Types: Marijuana     Allergies   Patient  has no known allergies.   Review of Systems Review of Systems  Constitutional: Negative for fever.  HENT: Negative for sore throat.   Eyes: Negative for visual disturbance.  Respiratory: Negative for cough and shortness of breath.   Cardiovascular: Negative for chest pain.  Gastrointestinal: Negative for abdominal pain, nausea and vomiting.  Genitourinary: Negative for difficulty urinating.  Skin: Positive for rash.  Neurological: Negative for syncope and headaches.     Physical Exam Updated Vital Signs BP (!) 131/91 (BP Location: Left Arm)   Pulse 63   Temp 98.7 F (37.1 C) (Oral)   Resp 18   Ht 5\' 8"  (1.727 m)   Wt 68 kg (150 lb)   LMP 05/23/2013   SpO2 99%   BMI 22.81 kg/m   Physical Exam    Constitutional: She is oriented to person, place, and time. She appears well-developed and well-nourished. No distress.  HENT:  Head: Normocephalic and atraumatic.  Eyes: Conjunctivae and EOM are normal.  Neck: Normal range of motion.  Cardiovascular: Normal rate, regular rhythm and intact distal pulses.  Pulmonary/Chest: Effort normal. No respiratory distress.  Musculoskeletal: She exhibits no edema or tenderness.  Neurological: She is alert and oriented to person, place, and time.  Skin: Skin is warm and dry. Rash (annular patches measuring 1-2 cm, scattered over abdomen and back, small erythematous papules over abdomen, between a few fingers) noted. She is not diaphoretic.  Nursing note and vitals reviewed.    ED Treatments / Results  Labs (all labs ordered are listed, but only abnormal results are displayed) Labs Reviewed - No data to display  EKG  EKG Interpretation None       Radiology No results found.  Procedures Procedures (including critical care time)  Medications Ordered in ED Medications - No data to display   Initial Impression / Assessment and Plan / ED Course  I have reviewed the triage vital signs and the nursing notes.  Pertinent labs & imaging results that were available during my care of the patient were reviewed by me and considered in my medical decision making (see chart for details).     47 year old female presents with concern for pruritic rash.  Exam and history are consistent with both tinea corporis as well as scabies.  Patient was given permethrin, discussed scabies eradication.  She was also given oral terbinafine given diffuse areas of tinea.  Recommend reapplication of the permethrin if she does not have improvement. Patient discharged in stable condition with understanding of reasons to return.   Final Clinical Impressions(s) / ED Diagnoses   Final diagnoses:  Scabies  Tinea corporis    ED Discharge Orders        Ordered     permethrin (ELIMITE) 5 % cream     06/24/17 1303    terbinafine (LAMISIL) 250 MG tablet  Daily     06/24/17 1305       Alvira Monday, MD 06/24/17 [id number]

## 2017-06-24 NOTE — ED Notes (Signed)
PT moved to FT due to scabies diagnosis established for she and her visitor.

## 2017-06-24 NOTE — ED Triage Notes (Signed)
Patient has a hx of scabies, and thinks that she is having another break out.

## 2023-10-16 ENCOUNTER — Other Ambulatory Visit: Payer: Self-pay

## 2023-10-16 ENCOUNTER — Emergency Department (HOSPITAL_BASED_OUTPATIENT_CLINIC_OR_DEPARTMENT_OTHER)
Admission: EM | Admit: 2023-10-16 | Discharge: 2023-10-16 | Disposition: A | Discharge status: Home / Self Care | Attending: Emergency Medicine | Admitting: Emergency Medicine

## 2023-10-16 ENCOUNTER — Encounter (HOSPITAL_BASED_OUTPATIENT_CLINIC_OR_DEPARTMENT_OTHER): Payer: Self-pay | Admitting: Urology

## 2023-10-16 DIAGNOSIS — H9202 Otalgia, left ear: Secondary | ICD-10-CM | POA: Diagnosis present

## 2023-10-16 DIAGNOSIS — H6692 Otitis media, unspecified, left ear: Secondary | ICD-10-CM | POA: Insufficient documentation

## 2023-10-16 DIAGNOSIS — J40 Bronchitis, not specified as acute or chronic: Secondary | ICD-10-CM | POA: Diagnosis not present

## 2023-10-16 DIAGNOSIS — Z87891 Personal history of nicotine dependence: Secondary | ICD-10-CM | POA: Diagnosis not present

## 2023-10-16 DIAGNOSIS — H6690 Otitis media, unspecified, unspecified ear: Secondary | ICD-10-CM

## 2023-10-16 MED ORDER — PREDNISONE 20 MG PO TABS: 40.0000 mg | ORAL_TABLET | Freq: Every day | ORAL | 0 refills | Status: AC

## 2023-10-16 MED ORDER — CEFPODOXIME PROXETIL 200 MG PO TABS: 200.0000 mg | ORAL_TABLET | Freq: Two times a day (BID) | ORAL | 0 refills | Status: AC

## 2023-10-16 MED ORDER — LEVOCETIRIZINE DIHYDROCHLORIDE 5 MG PO TABS: 5.0000 mg | ORAL_TABLET | Freq: Every evening | ORAL | 1 refills | Status: AC

## 2023-10-16 MED ORDER — ALBUTEROL SULFATE HFA 108 (90 BASE) MCG/ACT IN AERS
1.0000 | INHALATION_SPRAY | Freq: Once | RESPIRATORY_TRACT | Status: AC
  Administered 2023-10-16: 1 via RESPIRATORY_TRACT
  Filled 2023-10-16: qty 6.7

## 2023-10-16 NOTE — ED Provider Notes (Signed)
 [place] [facility] AT [facility] Provider Note   CSN: [id number] Arrival date & time: 10/16/23  1310     History  Chief Complaint  Patient presents with   Otalgia    Brenda Chen is a 53 y.o. female.  Has medical history of anemia and GERD presenting to ER with complaint of cough intermittently for 3 to 4 weeks, nasal drainage, left ear pain.  She has been on Augmentin for 4 days and has not had any improvement in symptoms.  She declines further workup in the ER or chest x-ray wants to try another antibiotic.   Otalgia      Home Medications Prior to Admission medications   Medication Sig Start Date End Date Taking? Authorizing Provider  azithromycin  (ZITHROMAX ) 250 MG tablet Take 1 tablet (250 mg total) by mouth daily. Take first 2 tablets together, then 1 every day until finished. 03/04/16   Ward, Veto Gowda, PA-C  benzonatate  (TESSALON ) 100 MG capsule Take 1 capsule (100 mg total) by mouth 3 (three) times daily as needed for cough. 03/04/16   Ward, Veto Gowda, PA-C  cyclobenzaprine  (FLEXERIL ) 10 MG tablet Take 10 mg by mouth 3 (three) times daily as needed for muscle spasms.    [provider]  ferrous sulfate 325 (65 FE) MG tablet Take 650 mg by mouth daily with breakfast.    [provider]  hydrOXYzine  (ATARAX /VISTARIL ) 25 MG tablet Take 1 tablet (25 mg total) by mouth every 6 (six) hours as needed for itching. 03/13/17   Kelsey Patricia, MD  Oxycodone  HCl 10 MG TABS Take 10 mg by mouth 4 (four) times daily as needed.    [provider]  permethrin  (ELIMITE ) 5 % cream Apply to affected area once from neck down to feet, leave on for 8-14 hours. May reapply in 14 days. 06/24/17   Scarlette Currier, MD  ranitidine (ZANTAC) 150 MG tablet Take 150 mg by mouth daily.    [provider]      Allergies    Patient has no known allergies.    Review of Systems   Review of Systems  HENT:  Positive for ear pain.      Physical Exam Updated Vital Signs BP (!) 152/106 (BP Location: Left Arm)   Pulse 88   Temp 98 F (36.7 C)   Resp 18   Ht 5\' 8"  (1.727 m)   Wt 68 kg   LMP 05/23/2013   SpO2 98%   BMI 22.79 kg/m  Physical Exam Vitals and nursing note reviewed.  Constitutional:      General: She is not in acute distress.    Appearance: She is not toxic-appearing.  HENT:     Head: Normocephalic and atraumatic.     Right Ear: Tympanic membrane, ear canal and external ear normal.     Ears:     Comments: Left AOM bulging and erythematous left TM.  No tenderness to palpation over mastoid bone.  External ear canal is within normal limits. Eyes:     General: No scleral icterus.    Conjunctiva/sclera: Conjunctivae normal.  Cardiovascular:     Rate and Rhythm: Normal rate and regular rhythm.     Pulses: Normal pulses.     Heart sounds: Normal heart sounds.  Pulmonary:     Effort: Pulmonary effort is normal. No respiratory distress.     Breath sounds: Normal breath sounds.  Abdominal:     General: Abdomen is flat. Bowel sounds are normal.  Palpations: Abdomen is soft.     Tenderness: There is no abdominal tenderness.  Skin:    General: Skin is warm and dry.     Findings: No lesion.  Neurological:     General: No focal deficit present.     Mental Status: She is alert and oriented to person, place, and time. Mental status is at baseline.     ED Results / Procedures / Treatments   Labs (all labs ordered are listed, but only abnormal results are displayed) Labs Reviewed - No data to display  EKG None  Radiology No results found.  Procedures Procedures    Medications Ordered in ED Medications - No data to display  ED Course/ Medical Decision Making/ A&P                                 Medical Decision Making  KRYSTAL TEACHEY 53 y.o. presented today for URI like symptoms. Working DDx that I considered at this time includes, but not limited to, viral illness, pharyngitis,  mono, sinusitis, electrolyte abnormality, AOM.  R/o DDx: these additional diagnoses are not consistent with patient's history, presentation, physical exam, labs/imaging findings.  Review of prior external notes: Reviewed recent office visit with urgent care in which patient was diagnosed with acute bacterial sinusitis.  Patient presented with complaint of left sided sinus pressure, ear pain and cough.   Imaging:  Patient declined chest x-ray.   Problem List / ED Course / Critical interventions / Medication management  Patient presented to emergency room with flulike symptoms.  She has been seen urgent care for similar symptoms.  Offered further workup however patient declines. Reports she has upcoming appointment with primary care and dose not want to wait for labs or imaging.  Given symptoms and failure of cough to improve with antibiotic feel that symptoms are most likely consistent with a viral bronchitis with concurrent left-sided acute otitis media.  She has normal phonation and is handling secretions.  No sign of peritonsillar abscess.  No sign of mastoiditis.  No sign of external ear infection. Her lungs are clear to auscultation bilaterally without any wheezing or rhonchi.  She is not hypoxic.  She does have extensive smoking history but denies formal diagnosis of COPD or asthma.  Will send her home with inhaler as needed for bronchospasm.  Will start her on 5 days of steroids and then trial of antihistamine.  I ordered medication including albuterol  inhaler  Patients vitals assessed. Upon arrival patient is hemodynamically stable.  I have reviewed the patients home medicines and have made adjustments as needed     Plan:  F/u w/ PCP in 2-3d to ensure resolution of sx.  Patient was given return precautions. Patient stable for discharge at this time.  Patient educated on sx and dx and verbalized understanding of plan. Return to ER if new or worsening sx.          Final Clinical  Impression(s) / ED Diagnoses Final diagnoses:  Acute otitis media, unspecified otitis media type  Bronchitis    Rx / DC Orders ED Discharge Orders     None         Eudora Heron, PA-C 10/16/23 1353    Long, Shereen Dike, MD 10/17/23 [phone]

## 2023-10-16 NOTE — ED Triage Notes (Signed)
 Pt had cold 4 weeks ago  Was seen at [facility] Friday  Was given amoxicillin for sinusitis  Today has continued pain in throat and left ear with drainage

## 2023-10-16 NOTE — ED Notes (Signed)
 Patient called in to see what scripts were called in.  Advised any scripts issued should be in discharge papers.  Patient found and confirmed information was there.

## 2023-10-16 NOTE — Discharge Instructions (Signed)
 You have an ear infection of your left ear.  I have sent an antibiotic please take as prescribed.  For your cough I would recommend using albuterol  inhaler as needed for shortness of breath and wheezing.  You can try 5 days of prednisone and antihistamine, which I have sent to your pharmacy.  For your pain you can take 1000 mg of Tylenol  every 8 hours.  You can also take ibuprofen every 8 hours.  Return to ER with new or worsening symptoms.
